# Patient Record
Sex: Male | Born: 1950 | Race: White | Hispanic: No | Marital: Married
Health system: Southern US, Community
[De-identification: ages and names within clinical notes are randomized; demographics above are authoritative.]

## PROBLEM LIST (undated history)

## (undated) DIAGNOSIS — R6520 Severe sepsis without septic shock: Secondary | ICD-10-CM

## (undated) DIAGNOSIS — N170 Acute kidney failure with tubular necrosis: Secondary | ICD-10-CM

## (undated) DIAGNOSIS — U071 COVID-19: Secondary | ICD-10-CM

## (undated) DIAGNOSIS — J1282 Pneumonia due to coronavirus disease 2019: Secondary | ICD-10-CM

## (undated) DIAGNOSIS — A419 Sepsis, unspecified organism: Secondary | ICD-10-CM

## (undated) DIAGNOSIS — J9621 Acute and chronic respiratory failure with hypoxia: Secondary | ICD-10-CM

---

## 2019-09-22 ENCOUNTER — Other Ambulatory Visit (HOSPITAL_COMMUNITY): Payer: Medicare Other

## 2019-09-22 ENCOUNTER — Inpatient Hospital Stay
Admission: AD | Admit: 2019-09-22 | Discharge: 2019-09-25 | Disposition: A | Discharge status: Other Acute Inpatient Hospital | Payer: Medicare Other | Source: Other Acute Inpatient Hospital | Attending: Internal Medicine | Admitting: Internal Medicine

## 2019-09-22 DIAGNOSIS — J9621 Acute and chronic respiratory failure with hypoxia: Secondary | ICD-10-CM | POA: Diagnosis present

## 2019-09-22 DIAGNOSIS — R0602 Shortness of breath: Secondary | ICD-10-CM

## 2019-09-22 DIAGNOSIS — R6520 Severe sepsis without septic shock: Secondary | ICD-10-CM | POA: Diagnosis present

## 2019-09-22 DIAGNOSIS — J1282 Pneumonia due to coronavirus disease 2019: Secondary | ICD-10-CM | POA: Diagnosis present

## 2019-09-22 DIAGNOSIS — U071 COVID-19: Secondary | ICD-10-CM | POA: Diagnosis present

## 2019-09-22 DIAGNOSIS — N170 Acute kidney failure with tubular necrosis: Secondary | ICD-10-CM | POA: Diagnosis present

## 2019-09-22 DIAGNOSIS — A419 Sepsis, unspecified organism: Secondary | ICD-10-CM | POA: Diagnosis present

## 2019-09-22 DIAGNOSIS — J189 Pneumonia, unspecified organism: Secondary | ICD-10-CM

## 2019-09-22 HISTORY — DX: COVID-19: U07.1

## 2019-09-22 HISTORY — DX: Sepsis, unspecified organism: A41.9

## 2019-09-22 HISTORY — DX: Pneumonia due to coronavirus disease 2019: J12.82

## 2019-09-22 HISTORY — DX: Sepsis, unspecified organism: R65.20

## 2019-09-22 HISTORY — DX: Acute kidney failure with tubular necrosis: N17.0

## 2019-09-22 HISTORY — DX: Acute and chronic respiratory failure with hypoxia: J96.21

## 2019-09-22 LAB — URINALYSIS, ROUTINE W REFLEX MICROSCOPIC
Bilirubin Urine: NEGATIVE
Glucose, UA: NEGATIVE mg/dL
Hgb urine dipstick: NEGATIVE
Ketones, ur: NEGATIVE mg/dL
Leukocytes,Ua: NEGATIVE
Nitrite: NEGATIVE
Protein, ur: NEGATIVE mg/dL
Specific Gravity, Urine: 1.017 (ref 1.005–1.030)
pH: 7 (ref 5.0–8.0)

## 2019-09-23 LAB — COMPREHENSIVE METABOLIC PANEL
ALT: 111 U/L — ABNORMAL HIGH (ref 0–44)
AST: 27 U/L (ref 15–41)
Albumin: 2.2 g/dL — ABNORMAL LOW (ref 3.5–5.0)
Alkaline Phosphatase: 84 U/L (ref 38–126)
Anion gap: 10 (ref 5–15)
BUN: 18 mg/dL (ref 8–23)
CO2: 24 mmol/L (ref 22–32)
Calcium: 9.2 mg/dL (ref 8.9–10.3)
Chloride: 97 mmol/L — ABNORMAL LOW (ref 98–111)
Creatinine, Ser: 1.11 mg/dL (ref 0.61–1.24)
GFR calc Af Amer: >60 mL/min (ref >60)
GFR calc non Af Amer: >60 mL/min (ref >60)
Glucose, Bld: 113 mg/dL — ABNORMAL HIGH (ref 70–99)
Potassium: 4.3 mmol/L (ref 3.5–5.1)
Sodium: 131 mmol/L — ABNORMAL LOW (ref 135–145)
Total Bilirubin: 1 mg/dL (ref 0.3–1.2)
Total Protein: 6.9 g/dL (ref 6.5–8.1)

## 2019-09-23 LAB — URINE CULTURE
Culture: NO GROWTH
Special Requests: NORMAL

## 2019-09-23 LAB — CBC WITH DIFFERENTIAL/PLATELET
Abs Immature Granulocytes: 0.28 10*3/uL — ABNORMAL HIGH (ref 0.00–0.07)
Basophils Absolute: 0.1 10*3/uL (ref 0.0–0.1)
Basophils Relative: 0 %
Eosinophils Absolute: 0.6 10*3/uL — ABNORMAL HIGH (ref 0.0–0.5)
Eosinophils Relative: 5 %
HCT: 40.9 % (ref 39.0–52.0)
Hemoglobin: 12.8 g/dL — ABNORMAL LOW (ref 13.0–17.0)
Immature Granulocytes: 2 %
Lymphocytes Relative: 18 %
Lymphs Abs: 2.6 10*3/uL (ref 0.7–4.0)
MCH: 28.3 pg (ref 26.0–34.0)
MCHC: 31.3 g/dL (ref 30.0–36.0)
MCV: 90.3 fL (ref 80.0–100.0)
Monocytes Absolute: 0.8 10*3/uL (ref 0.1–1.0)
Monocytes Relative: 6 %
Neutro Abs: 9.7 10*3/uL — ABNORMAL HIGH (ref 1.7–7.7)
Neutrophils Relative %: 69 %
Platelets: 401 10*3/uL — ABNORMAL HIGH (ref 150–400)
RBC: 4.53 MIL/uL (ref 4.22–5.81)
RDW: 14 % (ref 11.5–15.5)
WBC: 14 10*3/uL — ABNORMAL HIGH (ref 4.0–10.5)
nRBC: 0 % (ref 0.0–0.2)

## 2019-09-23 LAB — CK TOTAL AND CKMB (NOT AT ARMC)
CK, MB: 1.2 ng/mL (ref 0.5–5.0)
Relative Index: INVALID (ref 0.0–2.5)
Total CK: 39 U/L — ABNORMAL LOW (ref 49–397)

## 2019-09-24 ENCOUNTER — Encounter: Payer: Self-pay | Admitting: Internal Medicine

## 2019-09-24 ENCOUNTER — Other Ambulatory Visit (HOSPITAL_COMMUNITY): Payer: Medicare Other

## 2019-09-24 DIAGNOSIS — U071 COVID-19: Secondary | ICD-10-CM | POA: Diagnosis not present

## 2019-09-24 DIAGNOSIS — J9621 Acute and chronic respiratory failure with hypoxia: Secondary | ICD-10-CM | POA: Diagnosis not present

## 2019-09-24 DIAGNOSIS — A419 Sepsis, unspecified organism: Secondary | ICD-10-CM | POA: Diagnosis present

## 2019-09-24 DIAGNOSIS — N170 Acute kidney failure with tubular necrosis: Secondary | ICD-10-CM

## 2019-09-24 DIAGNOSIS — R6520 Severe sepsis without septic shock: Secondary | ICD-10-CM

## 2019-09-24 DIAGNOSIS — J1282 Pneumonia due to coronavirus disease 2019: Secondary | ICD-10-CM | POA: Diagnosis present

## 2019-09-24 LAB — EXPECTORATED SPUTUM ASSESSMENT W GRAM STAIN, RFLX TO RESP C

## 2019-09-24 NOTE — Consult Note (Signed)
Infectious Disease Consultation   Brian Guerra  IOE:[id number]  DOB: Oct 17, 1950  DOA: 09/22/2019  Requesting physician: Dr. Manson Passey  Reason for consultation: Antibiotic recommendations   History of Present Illness: Brian Guerra is an 69 y.o. male who presented to the emergency room and outside facility with 2 weeks of cough, worsening shortness of breath, fever, nausea, diarrhea.  Chest x-ray showed airspace opacities especially in the left base.  Patient was found to be positive for Covid 19 infection.  He was initially started on zinc, vitamin C, Decadron, remdesivir.  He was also given Solu-Medrol.  His procalcitonin was elevated.  He was started on ceftriaxone and azithromycin for empiric bacterial pneumonia.  Sputum cultures were positive for Streptococcus.  Blood and urine cultures were negative.  He completed 5-day course of antibiotic treatment with azithromycin, ceftriaxone, cefdinir.  He also had acute renal failure, elevated CK.  He was treated with IV fluids with improvement. Patient however continued to require heated high flow nasal cannula.  He was given incentive spirometry.  On 09/17/2019 he was started on p.o. prednisone.  On 09/19/2018 when he was started on inhaled corticosteroids because of reactive airway symptoms.  Due to him requiring high flow oxygen, debility he was transferred to [facility] and admitted here on 09/22/2019.  Patient apparently was noted to have fevers and was diagnosed with UTI with Enterococcus and was initially on ampicillin and subsequently switched to amoxicillin.  Blood cultures apparently were negative.  After admission here he was noted to have high fevers of 103.  Chest x-ray showing scattered parenchymal opacities.  Blood cultures, respiratory cultures ordered.  He is complaining of shortness of breath, cough with some sputum.  Denies having any nausea, vomiting, abdominal pain, diarrhea or dysuria.   Review of Systems:  Review  of systems negative except as mentioned above in the HPI.  Past Medical History: Obesity, no pertinent past Medical history since the patient had not seen a physician in years.  Past Surgical History: Orthopedic surgery  Allergies: No known drug allergies  Social History: Prior history of smoking and drinking but the patient quit 15 years ago  Family History: No pertinent family history  Physical Exam: Temperature 103, respiratory rate 33, blood pressure 143/85, pulse oximetry 96%  General appearance: Awake, complaining of shortness of breath Eyes: PERLA, EOMI  ENMT: external ears and nose appear normal, normal hearing, lips appears normal, moist oral mucosa Neck: neck appears normal, no masses, normal ROM CVS: S1-S2, tachycardic Respiratory: Decreased breath sounds lower lobes, rhonchi, no wheezing Abdomen: soft nontender, nondistended, normal bowel sounds Musculoskeletal: No edema Neuro: He has debility with generalized weakness otherwise grossly nonfocal Psych: judgement and insight appear normal, stable mood and affect Skin: no rashes  Data reviewed:  I have personally reviewed following labs and imaging studies Labs:  CBC: Recent Labs  Lab 09/23/19 0717  WBC 14.0*  NEUTROABS 9.7*  HGB 12.8*  HCT 40.9  MCV 90.3  PLT 401*    Basic Metabolic Panel: Recent Labs  Lab 09/23/19 0717  NA 131*  K 4.3  CL 97*  CO2 24  GLUCOSE 113*  BUN 18  CREATININE 1.11  CALCIUM 9.2   GFR CrCl cannot be calculated (Unknown ideal weight.). Liver Function Tests: Recent Labs  Lab 09/23/19 0717  AST 27  ALT 111*  ALKPHOS 84  BILITOT 1.0  PROT 6.9  ALBUMIN 2.2*   No results for input(s): LIPASE, AMYLASE  in the last 168 hours. No results for input(s): AMMONIA in the last 168 hours. Coagulation profile No results for input(s): INR, PROTIME in the last 168 hours.  Cardiac Enzymes: Recent Labs  Lab 09/23/19 0717  CKTOTAL 39*  CKMB 1.2   BNP: Invalid input(s):  POCBNP CBG: No results for input(s): GLUCAP in the last 168 hours. D-Dimer No results for input(s): DDIMER in the last 72 hours. Hgb A1c No results for input(s): HGBA1C in the last 72 hours. Lipid Profile No results for input(s): CHOL, HDL, LDLCALC, TRIG, CHOLHDL, LDLDIRECT in the last 72 hours. Thyroid function studies No results for input(s): TSH, T4TOTAL, T3FREE, THYROIDAB in the last 72 hours.  Invalid input(s): FREET3 Anemia work up No results for input(s): VITAMINB12, FOLATE, FERRITIN, TIBC, IRON, RETICCTPCT in the last 72 hours. Urinalysis    Component Value Date/Time   COLORURINE YELLOW 09/22/2019 2151   APPEARANCEUR CLEAR 09/22/2019 2151   LABSPEC 1.017 09/22/2019 2151   PHURINE 7.0 09/22/2019 2151   GLUCOSEU NEGATIVE 09/22/2019 2151   HGBUR NEGATIVE 09/22/2019 2151   BILIRUBINUR NEGATIVE 09/22/2019 2151   KETONESUR NEGATIVE 09/22/2019 2151   PROTEINUR NEGATIVE 09/22/2019 2151   NITRITE NEGATIVE 09/22/2019 2151   LEUKOCYTESUR NEGATIVE 09/22/2019 2151     Microbiology Recent Results (from the past 240 hour(s))  Culture, Urine     Status: None   Collection Time: 09/22/19  9:47 PM   Specimen: Urine, Random  Result Value Ref Range Status   Specimen Description URINE, RANDOM  Final   Special Requests Normal  Final   Culture   Final    NO GROWTH Performed at [facility], [address], [place], [place] [address]    Report Status 09/23/2019 FINAL  Final  Culture, blood (routine x 2)     Status: None (Preliminary result)   Collection Time: 09/22/19  9:51 PM   Specimen: BLOOD  Result Value Ref Range Status   Specimen Description BLOOD LEFT ANTECUBITAL  Final   Special Requests   Final    BOTTLES DRAWN AEROBIC AND ANAEROBIC Blood Culture results may not be optimal due to an inadequate volume of blood received in culture bottles   Culture   Final    NO GROWTH 2 DAYS Performed at [facility], [address] [place], [place] [address]    Report  Status PENDING  Incomplete  Culture, blood (routine x 2)     Status: None (Preliminary result)   Collection Time: 09/22/19  9:51 PM   Specimen: BLOOD LEFT HAND  Result Value Ref Range Status   Specimen Description BLOOD LEFT HAND  Final   Special Requests   Final    BOTTLES DRAWN AEROBIC AND ANAEROBIC Blood Culture results may not be optimal due to an inadequate volume of blood received in culture bottles   Culture   Final    NO GROWTH 2 DAYS Performed at [facility], [address], [place], [place] [address]    Report Status PENDING  Incomplete  Expectorated sputum assessment w rflx to resp cult     Status: None   Collection Time: 09/23/19 11:01 AM   Specimen: Expectorated Sputum  Result Value Ref Range Status   Specimen Description Expect. Sput  Final   Special Requests NONE  Final   Sputum evaluation   Final    THIS SPECIMEN IS ACCEPTABLE FOR SPUTUM CULTURE Performed at [facility], [address], [place], [place] [address]  Report Status 09/24/2019 FINAL  Final  Culture, respiratory     Status: None (Preliminary result)   Collection Time: 09/23/19 11:01 AM  Result Value Ref Range Status   Specimen Description Expect. Sput  Final   Special Requests NONE Reflexed from [id number]  Final   Gram Stain   Final    FEW WBC PRESENT, PREDOMINANTLY PMN RARE GRAM POSITIVE COCCI RARE GRAM NEGATIVE RODS RARE YEAST    Culture   Final    CULTURE REINCUBATED FOR BETTER GROWTH Performed at [facility], [address] [place], [place] [address]    Report Status PENDING  Incomplete    Inpatient Medications:   Please see MAR   Radiological Exams on Admission: No results found.  Impression/Recommendations Acute hypoxemic respiratory failure COVID-19 infection Fever Pneumonia UTI with Enterococcus Acute renal failure Rhabdomyolysis Transaminitis Diabetes mellitus type 2  Acute hypoxic respiratory failure: He had COVID-19 infection.  He continues to be on  heated high flow nasal cannula.  Continues to complain of shortness of breath.  Chest x-ray on 09/22/2019 showing scattered parenchymal opacities, concerning for possible secondary bacterial pneumonia?  Started on empiric cefepime.  Sputum cultures, blood cultures ordered.  Follow-up on the cultures and adjust antibiotics accordingly.  If his respiratory status is not improving consider obtaining chest CT to better evaluate.  Could be done without contrast if concern for renal compromise given his recent acute renal failure.  COVID-19 infection: He has received treatment with remdesivir, Decadron, Solu-Medrol at the outside facility.  Now started on hydroxyurea, folic acid, continued on Solu-Medrol.  Fever: Exact etiology for the fever is unclear.  Suspect he could be developing secondary bacterial pneumonia.  Started on empiric antibiotics as mentioned above.  Follow-up on the respiratory cultures and adjust antibiotics accordingly.  If he is continuing to have fever while on the cefepime suggest adding empiric vancomycin for MRSA coverage.  However, given his recent acute renal failure we need to monitor his BUN/creatinine very closely while on antibiotics.  If fever persisting despite antibiotics suggest CT of the chest/abdomen/pelvis to evaluate for other infectious etiology.  UTI: He had UTI with Enterococcus at the outside facility.  He was already treated with antibiotics at the outside facility. Here cultures have been sent due to ongoing fevers.  Started on empiric IV cefepime.  Follow-up on the cultures and adjust antibiotics accordingly.  Acute renal failure: Patient had acute renal failure secondary to rhabdomyolysis at the outside facility.  He was given IV fluids with improvement.  Please monitor BUN/trending closely while on the antibiotics.  Avoid nephrotoxic medications.  Transaminitis: He apparently had elevated LFTs at the outside facility.  Continue to monitor.  Further management per  the primary team.  Diabetes mellitus: Patient apparently had hemoglobin A1c of 6.3.  Continue Accu-Cheks, insulin sliding scale.  Further management per the primary team.  Due to his complex medical problems he is at risk for worsening and decompensation.  Plan of care discussed with the primary team and pharmacy.  Thank you for this consultation.   Vonzella Nipple M.D. 09/24/2019, 4:17 PM

## 2019-09-24 NOTE — Consult Note (Signed)
Pulmonary Critical Care Medicine [facility] [place]  PULMONARY SERVICE  Date of Service: 09/24/2019  PULMONARY CRITICAL CARE CONSULT   Brian Guerra  ZDG:[id number]  DOB: 01-18-1951   DOA: 09/22/2019  Referring Physician: Carron Curie, MD  HPI: Brian Guerra is a 69 y.o. male seen for follow up of Acute on Chronic Respiratory Failure.  Patient has multiple medical problems including severe sepsis acute renal failure rhabdomyolysis enterococcal urinary tract infection who presents to the hospital because of increasing cough and shortness of breath.  Patient has been noted on prior to admission to be having 2 weeks duration of the symptoms.  Chest x-ray revealed airspace opacities concern was made for COVID-19.  Swab was done which was apparently positive.  Patient was treated with Decadron remdesivir completed 5-day course however had continued worsening of his symptoms.  Eventually the patient was requiring high flow oxygen.  Patient also had elevated procalcitonin levels and was started on Rocephin and Zithromax.  The steroids were eventually tapered down.  Right now patient is on high flow oxygen at 40 L.  Review of Systems:  ROS performed and is unremarkable other than noted above.  Past medical history: COVID-19 Acute renal failure Sepsis Rhabdomyolysis Obesity  Past surgical history: Orthopedic surgery  Social history: History of tobacco or alcohol use quit 15 years ago  Family History: Non-Contributory to the present illness  Allergies: No known drug allergies  Medications: Reviewed on Rounds  Physical Exam:  Vitals: Temperature 98.1 pulse 87 respiratory rate 37 blood pressure is 113/67 saturations 95%  Ventilator Settings on 40 L with 100% FiO2  . General: Comfortable at this time . Eyes: Grossly normal lids, irises & conjunctiva . ENT: grossly tongue is normal . Neck: no obvious mass . Cardiovascular: S1-S2 normal no gallop or rub . Respiratory:  Scattered rhonchi coarse breath sounds noted bilaterally . Abdomen: Soft and nontender . Skin: no rash seen on limited exam . Musculoskeletal: not rigid . Psychiatric:unable to assess . Neurologic: no seizure no involuntary movements         Labs on Admission:  Basic Metabolic Panel: Recent Labs  Lab 09/23/19 0717  NA 131*  K 4.3  CL 97*  CO2 24  GLUCOSE 113*  BUN 18  CREATININE 1.11  CALCIUM 9.2    No results for input(s): PHART, PCO2ART, PO2ART, HCO3, O2SAT in the last 168 hours.  Liver Function Tests: Recent Labs  Lab 09/23/19 0717  AST 27  ALT 111*  ALKPHOS 84  BILITOT 1.0  PROT 6.9  ALBUMIN 2.2*   No results for input(s): LIPASE, AMYLASE in the last 168 hours. No results for input(s): AMMONIA in the last 168 hours.  CBC: Recent Labs  Lab 09/23/19 0717  WBC 14.0*  NEUTROABS 9.7*  HGB 12.8*  HCT 40.9  MCV 90.3  PLT 401*    Cardiac Enzymes: Recent Labs  Lab 09/23/19 0717  CKTOTAL 39*  CKMB 1.2    BNP (last 3 results) No results for input(s): BNP in the last 8760 hours.  ProBNP (last 3 results) No results for input(s): PROBNP in the last 8760 hours.   Radiological Exams on Admission: DG CHEST PORT 1 VIEW  Result Date: 09/24/2019 CLINICAL DATA:  69 year old male with shortness of breath. Positive COVID-19. EXAM: PORTABLE CHEST 1 VIEW COMPARISON:  Chest radiograph dated 09/22/2019. FINDINGS: Bilateral mid to lower lung field airspace opacities similar or slightly worsened since the prior radiograph. No pleural effusion or pneumothorax. Stable cardiac silhouette. Atherosclerotic calcification of  the aorta. No acute osseous pathology. IMPRESSION: Bilateral mid to lower lung field airspace opacities similar or slightly worsened since the prior radiograph. Electronically Signed   By: Elgie Collard M.D.   On: 09/24/2019 17:47   DG CHEST PORT 1 VIEW  Result Date: 09/22/2019 CLINICAL DATA:  History of COVID-19 positivity EXAM: PORTABLE CHEST 1 VIEW  COMPARISON:  None. FINDINGS: Cardiac shadow is at the upper limits of normal in size. Aortic calcifications are noted. Diffuse parenchymal opacities are noted bilaterally primarily in the bases without sizable effusion. No bony abnormality is noted. IMPRESSION: Scattered parenchymal opacities consistent with the patient's given clinical history of COVID-19 positivity. Electronically Signed   By: Alcide Clever M.D.   On: 09/22/2019 13:08    Assessment/Plan Active Problems:   Acute on chronic respiratory failure with hypoxia (HCC)   COVID-19 virus infection   Pneumonia due to COVID-19 virus   Acute renal failure due to tubular necrosis (HCC)   Severe sepsis (HCC)   1. Acute on chronic respiratory failure with hypoxia patient is currently on 40 L 100% FiO2.  Continue to monitor the patient's ABGs closely.  Chest x-ray still showing parenchymal opacities need to monitor closely for decompensation might possibly require intubation. 2. COVID-19 viral infection has been in resolution however patient has severe pulmonary damage 3. COVID-19 pneumonia has been completed remdesivir Decadron and Solu-Medrol slow to show improvement. 4. Acute renal failure due to tubular necrosis we will need to monitor patient's fluid status creatinine showing some improvement at 1.11 5. Severe sepsis has resolved right now hemodynamics are stable  I have personally seen and evaluated the patient, evaluated laboratory and imaging results, formulated the assessment and plan and placed orders. The Patient requires high complexity decision making with multiple systems involvement.  Case was discussed on Rounds with the Respiratory Therapy Director and the Respiratory staff Time Spent  Yevonne Pax, MD [facility] Pulmonary Critical Care Medicine Sleep Medicine

## 2019-09-25 ENCOUNTER — Inpatient Hospital Stay (HOSPITAL_COMMUNITY)
Admission: EM | Admit: 2019-09-25 | Discharge: 2019-10-14 | DRG: 207 | Disposition: E | Payer: Medicare Other | Source: Other Acute Inpatient Hospital | Attending: Pulmonary Disease | Admitting: Pulmonary Disease

## 2019-09-25 ENCOUNTER — Inpatient Hospital Stay (HOSPITAL_COMMUNITY): Payer: Medicare Other

## 2019-09-25 ENCOUNTER — Other Ambulatory Visit: Payer: Self-pay

## 2019-09-25 ENCOUNTER — Encounter (HOSPITAL_COMMUNITY): Payer: Medicare Other | Admitting: Certified Registered"

## 2019-09-25 ENCOUNTER — Inpatient Hospital Stay: Payer: Self-pay

## 2019-09-25 DIAGNOSIS — N170 Acute kidney failure with tubular necrosis: Secondary | ICD-10-CM | POA: Diagnosis present

## 2019-09-25 DIAGNOSIS — N179 Acute kidney failure, unspecified: Secondary | ICD-10-CM | POA: Diagnosis present

## 2019-09-25 DIAGNOSIS — R197 Diarrhea, unspecified: Secondary | ICD-10-CM | POA: Diagnosis not present

## 2019-09-25 DIAGNOSIS — R6521 Severe sepsis with septic shock: Secondary | ICD-10-CM | POA: Diagnosis present

## 2019-09-25 DIAGNOSIS — J9621 Acute and chronic respiratory failure with hypoxia: Secondary | ICD-10-CM | POA: Diagnosis not present

## 2019-09-25 DIAGNOSIS — Z4659 Encounter for fitting and adjustment of other gastrointestinal appliance and device: Secondary | ICD-10-CM

## 2019-09-25 DIAGNOSIS — J80 Acute respiratory distress syndrome: Secondary | ICD-10-CM | POA: Diagnosis present

## 2019-09-25 DIAGNOSIS — E871 Hypo-osmolality and hyponatremia: Secondary | ICD-10-CM | POA: Diagnosis present

## 2019-09-25 DIAGNOSIS — A4189 Other specified sepsis: Secondary | ICD-10-CM | POA: Diagnosis present

## 2019-09-25 DIAGNOSIS — A419 Sepsis, unspecified organism: Secondary | ICD-10-CM | POA: Diagnosis not present

## 2019-09-25 DIAGNOSIS — J690 Pneumonitis due to inhalation of food and vomit: Secondary | ICD-10-CM | POA: Diagnosis present

## 2019-09-25 DIAGNOSIS — E875 Hyperkalemia: Secondary | ICD-10-CM | POA: Diagnosis present

## 2019-09-25 DIAGNOSIS — I469 Cardiac arrest, cause unspecified: Secondary | ICD-10-CM | POA: Diagnosis present

## 2019-09-25 DIAGNOSIS — U071 COVID-19: Secondary | ICD-10-CM

## 2019-09-25 DIAGNOSIS — B948 Sequelae of other specified infectious and parasitic diseases: Secondary | ICD-10-CM | POA: Diagnosis not present

## 2019-09-25 DIAGNOSIS — J154 Pneumonia due to other streptococci: Secondary | ICD-10-CM | POA: Diagnosis present

## 2019-09-25 DIAGNOSIS — E878 Other disorders of electrolyte and fluid balance, not elsewhere classified: Secondary | ICD-10-CM | POA: Diagnosis present

## 2019-09-25 DIAGNOSIS — E872 Acidosis: Secondary | ICD-10-CM | POA: Diagnosis present

## 2019-09-25 DIAGNOSIS — Z87891 Personal history of nicotine dependence: Secondary | ICD-10-CM | POA: Diagnosis not present

## 2019-09-25 DIAGNOSIS — E1165 Type 2 diabetes mellitus with hyperglycemia: Secondary | ICD-10-CM | POA: Diagnosis present

## 2019-09-25 DIAGNOSIS — J9601 Acute respiratory failure with hypoxia: Secondary | ICD-10-CM | POA: Diagnosis not present

## 2019-09-25 DIAGNOSIS — Z452 Encounter for adjustment and management of vascular access device: Secondary | ICD-10-CM

## 2019-09-25 DIAGNOSIS — R1110 Vomiting, unspecified: Secondary | ICD-10-CM

## 2019-09-25 DIAGNOSIS — R609 Edema, unspecified: Secondary | ICD-10-CM | POA: Diagnosis not present

## 2019-09-25 DIAGNOSIS — Z66 Do not resuscitate: Secondary | ICD-10-CM | POA: Diagnosis not present

## 2019-09-25 DIAGNOSIS — R0902 Hypoxemia: Secondary | ICD-10-CM

## 2019-09-25 DIAGNOSIS — Z01818 Encounter for other preprocedural examination: Secondary | ICD-10-CM

## 2019-09-25 DIAGNOSIS — Z515 Encounter for palliative care: Secondary | ICD-10-CM | POA: Diagnosis not present

## 2019-09-25 DIAGNOSIS — R000 Tachycardia, unspecified: Secondary | ICD-10-CM

## 2019-09-25 DIAGNOSIS — Z978 Presence of other specified devices: Secondary | ICD-10-CM

## 2019-09-25 DIAGNOSIS — I2699 Other pulmonary embolism without acute cor pulmonale: Secondary | ICD-10-CM | POA: Diagnosis not present

## 2019-09-25 LAB — BASIC METABOLIC PANEL
Anion gap: 9 (ref 5–15)
Anion gap: 11 (ref 5–15)
BUN: 26 mg/dL — ABNORMAL HIGH (ref 8–23)
BUN: 45 mg/dL — ABNORMAL HIGH (ref 8–23)
CO2: 23 mmol/L (ref 22–32)
CO2: 26 mmol/L (ref 22–32)
Calcium: 8.9 mg/dL (ref 8.9–10.3)
Calcium: 8.9 mg/dL (ref 8.9–10.3)
Chloride: 98 mmol/L (ref 98–111)
Chloride: 99 mmol/L (ref 98–111)
Creatinine, Ser: 1.26 mg/dL — ABNORMAL HIGH (ref 0.61–1.24)
Creatinine, Ser: 2.44 mg/dL — ABNORMAL HIGH (ref 0.61–1.24)
GFR calc Af Amer: >60 mL/min (ref >60)
GFR calc Af Amer: 31 mL/min — ABNORMAL LOW (ref >60)
GFR calc non Af Amer: 59 mL/min — ABNORMAL LOW (ref >60)
GFR calc non Af Amer: 26 mL/min — ABNORMAL LOW (ref >60)
Glucose, Bld: 167 mg/dL — ABNORMAL HIGH (ref 70–99)
Glucose, Bld: 169 mg/dL — ABNORMAL HIGH (ref 70–99)
Potassium: 4.7 mmol/L (ref 3.5–5.1)
Potassium: 5.9 mmol/L — ABNORMAL HIGH (ref 3.5–5.1)
Sodium: 130 mmol/L — ABNORMAL LOW (ref 135–145)
Sodium: 136 mmol/L (ref 135–145)

## 2019-09-25 LAB — CBC
HCT: 38.9 % — ABNORMAL LOW (ref 39.0–52.0)
HCT: 43.1 % (ref 39.0–52.0)
Hemoglobin: 12.3 g/dL — ABNORMAL LOW (ref 13.0–17.0)
Hemoglobin: 13 g/dL (ref 13.0–17.0)
MCH: 28.5 pg (ref 26.0–34.0)
MCH: 28.3 pg (ref 26.0–34.0)
MCHC: 31.6 g/dL (ref 30.0–36.0)
MCHC: 30.2 g/dL (ref 30.0–36.0)
MCV: 90 fL (ref 80.0–100.0)
MCV: 93.7 fL (ref 80.0–100.0)
Platelets: 423 10*3/uL — ABNORMAL HIGH (ref 150–400)
Platelets: 467 K/uL — ABNORMAL HIGH (ref 150–400)
RBC: 4.32 MIL/uL (ref 4.22–5.81)
RBC: 4.6 MIL/uL (ref 4.22–5.81)
RDW: 13.7 % (ref 11.5–15.5)
RDW: 13.9 % (ref 11.5–15.5)
WBC: 21.9 10*3/uL — ABNORMAL HIGH (ref 4.0–10.5)
WBC: 25.1 K/uL — ABNORMAL HIGH (ref 4.0–10.5)
nRBC: 0.1 % (ref 0.0–0.2)
nRBC: 0.1 % (ref 0.0–0.2)

## 2019-09-25 LAB — COMPREHENSIVE METABOLIC PANEL WITH GFR
ALT: 157 U/L — ABNORMAL HIGH (ref 0–44)
AST: 70 U/L — ABNORMAL HIGH (ref 15–41)
Albumin: 2.1 g/dL — ABNORMAL LOW (ref 3.5–5.0)
Alkaline Phosphatase: 148 U/L — ABNORMAL HIGH (ref 38–126)
Anion gap: 9 (ref 5–15)
BUN: 28 mg/dL — ABNORMAL HIGH (ref 8–23)
CO2: 27 mmol/L (ref 22–32)
Calcium: 9.5 mg/dL (ref 8.9–10.3)
Chloride: 98 mmol/L (ref 98–111)
Creatinine, Ser: 1.28 mg/dL — ABNORMAL HIGH (ref 0.61–1.24)
GFR calc Af Amer: >60 mL/min
GFR calc non Af Amer: 58 mL/min — ABNORMAL LOW
Glucose, Bld: 207 mg/dL — ABNORMAL HIGH (ref 70–99)
Potassium: 5.2 mmol/L — ABNORMAL HIGH (ref 3.5–5.1)
Sodium: 134 mmol/L — ABNORMAL LOW (ref 135–145)
Total Bilirubin: 1 mg/dL (ref 0.3–1.2)
Total Protein: 7.6 g/dL (ref 6.5–8.1)

## 2019-09-25 LAB — BLOOD GAS, ARTERIAL
Acid-base deficit: 2.7 mmol/L — ABNORMAL HIGH (ref 0.0–2.0)
Acid-base deficit: 2.5 mmol/L — ABNORMAL HIGH (ref 0.0–2.0)
Bicarbonate: 22.8 mmol/L (ref 20.0–28.0)
Bicarbonate: 27.3 mmol/L (ref 20.0–28.0)
Drawn by: 51155
FIO2: 100
FIO2: 100
O2 Saturation: 87.4 %
O2 Saturation: 93.1 %
Patient temperature: 38.5
Patient temperature: 38.5
pCO2 arterial: 51.4 mmHg — ABNORMAL HIGH (ref 32.0–48.0)
pCO2 arterial: 114 mmHg (ref 32.0–48.0)
pH, Arterial: 7.279 — ABNORMAL LOW (ref 7.350–7.450)
pH, Arterial: 7.022 — CL (ref 7.350–7.450)
pO2, Arterial: 70.1 mmHg — ABNORMAL LOW (ref 83.0–108.0)
pO2, Arterial: 114 mmHg — ABNORMAL HIGH (ref 83.0–108.0)

## 2019-09-25 LAB — URINALYSIS, ROUTINE W REFLEX MICROSCOPIC
Bilirubin Urine: NEGATIVE
Glucose, UA: NEGATIVE mg/dL
Ketones, ur: NEGATIVE mg/dL
Leukocytes,Ua: NEGATIVE
Nitrite: NEGATIVE
Protein, ur: 30 mg/dL — AB
Specific Gravity, Urine: 1.021 (ref 1.005–1.030)
pH: 5 (ref 5.0–8.0)

## 2019-09-25 LAB — POCT I-STAT 7, (LYTES, BLD GAS, ICA,H+H)
Acid-Base Excess: 0 mmol/L (ref 0.0–2.0)
Acid-base deficit: 2 mmol/L (ref 0.0–2.0)
Acid-base deficit: 4 mmol/L — ABNORMAL HIGH (ref 0.0–2.0)
Bicarbonate: 28.9 mmol/L — ABNORMAL HIGH (ref 20.0–28.0)
Bicarbonate: 27.1 mmol/L (ref 20.0–28.0)
Bicarbonate: 29.1 mmol/L — ABNORMAL HIGH (ref 20.0–28.0)
Calcium, Ion: 1.23 mmol/L (ref 1.15–1.40)
Calcium, Ion: 1.23 mmol/L (ref 1.15–1.40)
Calcium, Ion: 1.23 mmol/L (ref 1.15–1.40)
HCT: 38 % — ABNORMAL LOW (ref 39.0–52.0)
HCT: 38 % — ABNORMAL LOW (ref 39.0–52.0)
HCT: 36 % — ABNORMAL LOW (ref 39.0–52.0)
Hemoglobin: 12.9 g/dL — ABNORMAL LOW (ref 13.0–17.0)
Hemoglobin: 12.9 g/dL — ABNORMAL LOW (ref 13.0–17.0)
Hemoglobin: 12.2 g/dL — ABNORMAL LOW (ref 13.0–17.0)
O2 Saturation: 96 %
O2 Saturation: 98 %
O2 Saturation: 98 %
Patient temperature: 36.5
Patient temperature: 97.8
Potassium: 6.1 mmol/L — ABNORMAL HIGH (ref 3.5–5.1)
Potassium: 6.4 mmol/L (ref 3.5–5.1)
Potassium: 5.7 mmol/L — ABNORMAL HIGH (ref 3.5–5.1)
Sodium: 135 mmol/L (ref 135–145)
Sodium: 135 mmol/L (ref 135–145)
Sodium: 134 mmol/L — ABNORMAL LOW (ref 135–145)
TCO2: 31 mmol/L (ref 22–32)
TCO2: 30 mmol/L (ref 22–32)
TCO2: 31 mmol/L (ref 22–32)
pCO2 arterial: 79.5 mmHg (ref 32.0–48.0)
pCO2 arterial: 80.1 mmHg (ref 32.0–48.0)
pCO2 arterial: 71.6 mmHg (ref 32.0–48.0)
pH, Arterial: 7.165 — CL (ref 7.350–7.450)
pH, Arterial: 7.138 — CL (ref 7.350–7.450)
pH, Arterial: 7.214 — ABNORMAL LOW (ref 7.350–7.450)
pO2, Arterial: 109 mmHg — ABNORMAL HIGH (ref 83.0–108.0)
pO2, Arterial: 144 mmHg — ABNORMAL HIGH (ref 83.0–108.0)
pO2, Arterial: 138 mmHg — ABNORMAL HIGH (ref 83.0–108.0)

## 2019-09-25 LAB — ECHOCARDIOGRAM COMPLETE
Height: 67 in
S' Lateral: 3.35 cm
Weight: 2987.67 oz

## 2019-09-25 LAB — GLUCOSE, CAPILLARY
Glucose-Capillary: 205 mg/dL — ABNORMAL HIGH (ref 70–99)
Glucose-Capillary: 176 mg/dL — ABNORMAL HIGH (ref 70–99)
Glucose-Capillary: 188 mg/dL — ABNORMAL HIGH (ref 70–99)
Glucose-Capillary: 153 mg/dL — ABNORMAL HIGH (ref 70–99)

## 2019-09-25 LAB — PHOSPHORUS: Phosphorus: 5.8 mg/dL — ABNORMAL HIGH (ref 2.5–4.6)

## 2019-09-25 LAB — HEMOGLOBIN A1C
Hgb A1c MFr Bld: 6.7 % — ABNORMAL HIGH (ref 4.8–5.6)
Mean Plasma Glucose: 145.59 mg/dL

## 2019-09-25 LAB — HIV ANTIBODY (ROUTINE TESTING W REFLEX): HIV Screen 4th Generation wRfx: NONREACTIVE

## 2019-09-25 LAB — TROPONIN I (HIGH SENSITIVITY)
Troponin I (High Sensitivity): 441 ng/L (ref <18)
Troponin I (High Sensitivity): 870 ng/L (ref <18)

## 2019-09-25 LAB — MAGNESIUM: Magnesium: 2.3 mg/dL (ref 1.7–2.4)

## 2019-09-25 LAB — PROCALCITONIN: Procalcitonin: 3.18 ng/mL

## 2019-09-25 LAB — D-DIMER, QUANTITATIVE: D-Dimer, Quant: 9.03 ug{FEU}/mL — ABNORMAL HIGH (ref 0.00–0.50)

## 2019-09-25 LAB — CK: Total CK: 124 U/L (ref 49–397)

## 2019-09-25 MED ORDER — SODIUM CHLORIDE 0.9 % IV SOLN: INTRAVENOUS | Status: DC | PRN

## 2019-09-25 MED ORDER — INSULIN ASPART 100 UNIT/ML ~~LOC~~ SOLN
0.0000 [IU] | SUBCUTANEOUS | Status: DC
  Administered 2019-09-25: 5 [IU] via SUBCUTANEOUS
  Administered 2019-09-25 (×3): 3 [IU] via SUBCUTANEOUS
  Administered 2019-09-26: 5 [IU] via SUBCUTANEOUS
  Administered 2019-09-26 (×2): 3 [IU] via SUBCUTANEOUS
  Administered 2019-09-26: 2 [IU] via SUBCUTANEOUS
  Administered 2019-09-26: 3 [IU] via SUBCUTANEOUS
  Administered 2019-09-26: 5 [IU] via SUBCUTANEOUS
  Administered 2019-09-27 (×2): 3 [IU] via SUBCUTANEOUS
  Administered 2019-09-27 (×2): 5 [IU] via SUBCUTANEOUS
  Administered 2019-09-27: 3 [IU] via SUBCUTANEOUS
  Administered 2019-09-28 (×2): 5 [IU] via SUBCUTANEOUS
  Administered 2019-09-28 – 2019-09-29 (×6): 3 [IU] via SUBCUTANEOUS
  Administered 2019-09-29: 2 [IU] via SUBCUTANEOUS
  Administered 2019-09-29: 3 [IU] via SUBCUTANEOUS
  Administered 2019-09-29: 2 [IU] via SUBCUTANEOUS
  Administered 2019-09-30 (×3): 3 [IU] via SUBCUTANEOUS
  Administered 2019-09-30: 2 [IU] via SUBCUTANEOUS
  Administered 2019-09-30: 5 [IU] via SUBCUTANEOUS
  Administered 2019-10-01: 8 [IU] via SUBCUTANEOUS
  Administered 2019-10-01: 5 [IU] via SUBCUTANEOUS
  Administered 2019-10-01: 11 [IU] via SUBCUTANEOUS
  Administered 2019-10-01: 8 [IU] via SUBCUTANEOUS

## 2019-09-25 MED ORDER — HEPARIN SODIUM (PORCINE) 5000 UNIT/ML IJ SOLN
5000.0000 [IU] | Freq: Three times a day (TID) | INTRAMUSCULAR | Status: DC
  Administered 2019-09-25 – 2019-09-29 (×12): 5000 [IU] via SUBCUTANEOUS
  Filled 2019-09-25 (×11): qty 1

## 2019-09-25 MED ORDER — CHLORHEXIDINE GLUCONATE 0.12% ORAL RINSE (MEDLINE KIT)
15.0000 mL | Freq: Two times a day (BID) | OROMUCOSAL | Status: DC
  Administered 2019-09-25 – 2019-10-01 (×13): 15 mL via OROMUCOSAL

## 2019-09-25 MED ORDER — ETOMIDATE 2 MG/ML IV SOLN
INTRAVENOUS | Status: DC | PRN
  Administered 2019-09-25: 10 mg via INTRAVENOUS

## 2019-09-25 MED ORDER — FENTANYL BOLUS VIA INFUSION
25.0000 ug | INTRAVENOUS | Status: DC | PRN
  Administered 2019-09-27 – 2019-09-30 (×3): 25 ug via INTRAVENOUS
  Filled 2019-09-25: qty 25

## 2019-09-25 MED ORDER — SODIUM CHLORIDE 0.9 % IV BOLUS
1000.0000 mL | Freq: Once | INTRAVENOUS | Status: AC
  Administered 2019-09-25: 1000 mL via INTRAVENOUS

## 2019-09-25 MED ORDER — PROSOURCE TF PO LIQD
90.0000 mL | Freq: Three times a day (TID) | ORAL | Status: DC
  Administered 2019-09-25 – 2019-09-28 (×9): 90 mL
  Filled 2019-09-25 (×10): qty 90

## 2019-09-25 MED ORDER — DOCUSATE SODIUM 50 MG/5ML PO LIQD
100.0000 mg | Freq: Two times a day (BID) | ORAL | Status: DC
  Administered 2019-09-25 – 2019-09-30 (×5): 100 mg via ORAL
  Filled 2019-09-25 (×5): qty 10

## 2019-09-25 MED ORDER — NOREPINEPHRINE 4 MG/250ML-% IV SOLN
0.0000 ug/min | INTRAVENOUS | Status: DC
  Administered 2019-09-26: 5 ug/min via INTRAVENOUS
  Administered 2019-09-26: 6 ug/min via INTRAVENOUS
  Filled 2019-09-25 (×3): qty 250

## 2019-09-25 MED ORDER — SODIUM ZIRCONIUM CYCLOSILICATE 10 G PO PACK
10.0000 g | PACK | Freq: Once | ORAL | Status: AC
  Administered 2019-09-25: 10 g
  Filled 2019-09-25: qty 1

## 2019-09-25 MED ORDER — PROPOFOL 10 MG/ML IV BOLUS
INTRAVENOUS | Status: DC | PRN
  Administered 2019-09-25: 50 mg via INTRAVENOUS
  Administered 2019-09-25: 70 mg via INTRAVENOUS
  Administered 2019-09-25: 130 mg via INTRAVENOUS

## 2019-09-25 MED ORDER — INSULIN ASPART 100 UNIT/ML IV SOLN
8.0000 [IU] | Freq: Once | INTRAVENOUS | Status: AC
  Administered 2019-09-25: 8 [IU] via INTRAVENOUS

## 2019-09-25 MED ORDER — SODIUM CHLORIDE 0.9 % IV SOLN
2.0000 g | Freq: Three times a day (TID) | INTRAVENOUS | Status: DC
  Administered 2019-09-25 (×2): 2 g via INTRAVENOUS
  Filled 2019-09-25 (×4): qty 2

## 2019-09-25 MED ORDER — NOREPINEPHRINE 4 MG/250ML-% IV SOLN
INTRAVENOUS | Status: AC
  Administered 2019-09-25: 20 ug/min via INTRAVENOUS
  Filled 2019-09-25: qty 250

## 2019-09-25 MED ORDER — SODIUM BICARBONATE 8.4 % IV SOLN
100.0000 meq | Freq: Once | INTRAVENOUS | Status: AC
  Administered 2019-09-25: 100 meq via INTRAVENOUS

## 2019-09-25 MED ORDER — EPINEPHRINE 1 MG/10ML IJ SOSY
PREFILLED_SYRINGE | INTRAMUSCULAR | Status: AC
  Filled 2019-09-25: qty 10

## 2019-09-25 MED ORDER — SODIUM BICARBONATE 8.4 % IV SOLN
INTRAVENOUS | Status: AC
  Filled 2019-09-25: qty 100

## 2019-09-25 MED ORDER — CALCIUM GLUCONATE-NACL 1-0.675 GM/50ML-% IV SOLN
1.0000 g | Freq: Once | INTRAVENOUS | Status: AC
  Administered 2019-09-25: 1000 mg via INTRAVENOUS
  Filled 2019-09-25: qty 50

## 2019-09-25 MED ORDER — FENTANYL CITRATE (PF) 100 MCG/2ML IJ SOLN
INTRAMUSCULAR | Status: AC
  Filled 2019-09-25: qty 2

## 2019-09-25 MED ORDER — METHYLPREDNISOLONE SODIUM SUCC 40 MG IJ SOLR
40.0000 mg | Freq: Two times a day (BID) | INTRAMUSCULAR | Status: DC
  Administered 2019-09-25 – 2019-09-27 (×6): 40 mg via INTRAVENOUS
  Filled 2019-09-25 (×6): qty 1

## 2019-09-25 MED ORDER — CHLORHEXIDINE GLUCONATE CLOTH 2 % EX PADS
6.0000 | MEDICATED_PAD | Freq: Every day | CUTANEOUS | Status: DC
  Administered 2019-09-25 – 2019-10-01 (×9): 6 via TOPICAL

## 2019-09-25 MED ORDER — VANCOMYCIN HCL IN DEXTROSE 1-5 GM/200ML-% IV SOLN
1000.0000 mg | INTRAVENOUS | Status: DC
  Administered 2019-09-26 – 2019-09-27 (×2): 1000 mg via INTRAVENOUS
  Filled 2019-09-25 (×2): qty 200

## 2019-09-25 MED ORDER — POLYETHYLENE GLYCOL 3350 17 G PO PACK
17.0000 g | PACK | Freq: Every day | ORAL | Status: DC
  Administered 2019-09-25 – 2019-09-26 (×2): 17 g via ORAL
  Filled 2019-09-25 (×2): qty 1

## 2019-09-25 MED ORDER — VANCOMYCIN HCL 2000 MG/400ML IV SOLN
2000.0000 mg | Freq: Once | INTRAVENOUS | Status: AC
  Administered 2019-09-25: 2000 mg via INTRAVENOUS
  Filled 2019-09-25: qty 400

## 2019-09-25 MED ORDER — FENTANYL CITRATE (PF) 100 MCG/2ML IJ SOLN
25.0000 ug | Freq: Once | INTRAMUSCULAR | Status: AC
  Administered 2019-09-25: 25 ug via INTRAVENOUS

## 2019-09-25 MED ORDER — ORAL CARE MOUTH RINSE
15.0000 mL | OROMUCOSAL | Status: DC
  Administered 2019-09-25 – 2019-10-01 (×61): 15 mL via OROMUCOSAL

## 2019-09-25 MED ORDER — DEXTROSE 50 % IV SOLN
25.0000 mL | Freq: Once | INTRAVENOUS | Status: AC
  Administered 2019-09-25: 25 mL via INTRAVENOUS
  Filled 2019-09-25: qty 50

## 2019-09-25 MED ORDER — SODIUM CHLORIDE 0.9% FLUSH
10.0000 mL | Freq: Two times a day (BID) | INTRAVENOUS | Status: DC
  Administered 2019-09-26 – 2019-10-01 (×9): 10 mL

## 2019-09-25 MED ORDER — SUCCINYLCHOLINE CHLORIDE 20 MG/ML IJ SOLN
INTRAMUSCULAR | Status: DC | PRN
  Administered 2019-09-25: 100 mg via INTRAVENOUS

## 2019-09-25 MED ORDER — STERILE WATER FOR INJECTION IV SOLN
INTRAVENOUS | Status: DC
  Filled 2019-09-25 (×2): qty 850

## 2019-09-25 MED ORDER — VITAL AF 1.2 CAL PO LIQD
1000.0000 mL | ORAL | Status: DC
  Administered 2019-09-25 – 2019-09-26 (×3): 1000 mL

## 2019-09-25 MED ORDER — ROCURONIUM BROMIDE 50 MG/5ML IV SOLN
50.0000 mg | Freq: Once | INTRAVENOUS | Status: AC
  Administered 2019-09-25: 50 mg via INTRAVENOUS
  Filled 2019-09-25: qty 5

## 2019-09-25 MED ORDER — VECURONIUM BROMIDE 10 MG IV SOLR
10.0000 mg | Freq: Once | INTRAVENOUS | Status: AC
  Administered 2019-09-25: 10 mg via INTRAVENOUS
  Filled 2019-09-25: qty 10

## 2019-09-25 MED ORDER — FENTANYL 2500MCG IN NS 250ML (10MCG/ML) PREMIX INFUSION
25.0000 ug/h | INTRAVENOUS | Status: DC
  Administered 2019-09-25: 25 ug/h via INTRAVENOUS
  Administered 2019-09-26: 26 ug/h via INTRAVENOUS
  Administered 2019-09-27: 35 ug/h via INTRAVENOUS
  Administered 2019-09-28: 150 ug/h via INTRAVENOUS
  Administered 2019-09-28 – 2019-10-01 (×5): 200 ug/h via INTRAVENOUS
  Filled 2019-09-25 (×10): qty 250

## 2019-09-25 MED ORDER — ROCURONIUM BROMIDE 10 MG/ML (PF) SYRINGE
PREFILLED_SYRINGE | INTRAVENOUS | Status: AC
  Filled 2019-09-25: qty 10

## 2019-09-25 MED ORDER — SODIUM CHLORIDE 0.9 % IV SOLN
2.0000 g | Freq: Two times a day (BID) | INTRAVENOUS | Status: DC
  Administered 2019-09-26 – 2019-09-27 (×3): 2 g via INTRAVENOUS
  Filled 2019-09-25 (×5): qty 2

## 2019-09-25 MED ORDER — NOREPINEPHRINE 4 MG/250ML-% IV SOLN
INTRAVENOUS | Status: AC
  Filled 2019-09-25: qty 500

## 2019-09-25 MED ORDER — PANTOPRAZOLE SODIUM 40 MG PO PACK
40.0000 mg | PACK | Freq: Every day | ORAL | Status: DC
  Administered 2019-09-25 – 2019-09-28 (×4): 40 mg
  Filled 2019-09-25 (×4): qty 20

## 2019-09-25 MED ORDER — FENTANYL CITRATE (PF) 100 MCG/2ML IJ SOLN
100.0000 ug | Freq: Once | INTRAMUSCULAR | Status: AC
  Administered 2019-09-25: 100 ug via INTRAVENOUS

## 2019-09-25 MED ORDER — POLYETHYLENE GLYCOL 3350 17 G PO PACK: 17.0000 g | PACK | Freq: Every day | ORAL | Status: DC | PRN

## 2019-09-25 MED ORDER — VANCOMYCIN HCL 750 MG/150ML IV SOLN
750.0000 mg | Freq: Two times a day (BID) | INTRAVENOUS | Status: DC
  Filled 2019-09-25: qty 150

## 2019-09-25 MED ORDER — PROPOFOL 1000 MG/100ML IV EMUL
0.0000 ug/kg/min | INTRAVENOUS | Status: DC
  Administered 2019-09-25: 50 ug/kg/min via INTRAVENOUS
  Administered 2019-09-25: 30 ug/kg/min via INTRAVENOUS
  Administered 2019-09-25 (×4): 50 ug/kg/min via INTRAVENOUS
  Administered 2019-09-26: 40 ug/kg/min via INTRAVENOUS
  Administered 2019-09-26 (×2): 35 ug/kg/min via INTRAVENOUS
  Administered 2019-09-26: 40 ug/kg/min via INTRAVENOUS
  Administered 2019-09-26: 50 ug/kg/min via INTRAVENOUS
  Administered 2019-09-26: 40 ug/kg/min via INTRAVENOUS
  Administered 2019-09-27: 35 ug/kg/min via INTRAVENOUS
  Administered 2019-09-27: 20 ug/kg/min via INTRAVENOUS
  Administered 2019-09-27 (×2): 35 ug/kg/min via INTRAVENOUS
  Administered 2019-09-28 – 2019-09-29 (×2): 20 ug/kg/min via INTRAVENOUS
  Administered 2019-09-30: 5 ug/kg/min via INTRAVENOUS
  Administered 2019-09-30 (×2): 20 ug/kg/min via INTRAVENOUS
  Administered 2019-10-01: 25 ug/kg/min via INTRAVENOUS
  Filled 2019-09-25 (×10): qty 100
  Filled 2019-09-25: qty 200
  Filled 2019-09-25 (×14): qty 100

## 2019-09-25 NOTE — Progress Notes (Signed)
°  Echocardiogram 2D Echocardiogram has been performed.  [name] M 09/24/2019, 11:23 AM

## 2019-09-25 NOTE — Progress Notes (Addendum)
Pharmacy Antibiotic Note  Brian Guerra is a 69 y.o. male admitted on 10/12/2019 with fevers/possible PNA.  Pharmacy has been consulted for vancomycin dosing.  Patient was diagnosed with COVID on 7/21 at OSH.  S/p remdesivir x 5 days, Decadron x 10 days, and Solu-Medrol x 5 days. On 8/10 patient was transferred to [facility] for continued care on 40L high flow oxygen.  Recent txt for enterococcus UTI and strep PNA.  Patient found to be in severe respiratory distress 8/13 requiring intubation.    WBC 21, Scr 1.26 (uptrending), CrCl 58 mL/min  Plan: Vancomycin 2000mg  loading dose, followed by Vancomycin 750 IV every 12 hours.  Goal trough 15-20 mcg/mL.  Height: 5\' 7"  (170.2 cm) Weight: 84.7 kg (186 lb 11.7 oz) IBW/kg (Calculated) : 66.1  Temp (24hrs), Avg:101.4 F (38.6 C), Min:101.4 F (38.6 C), Max:101.4 F (38.6 C)  Recent Labs  Lab 09/23/19 0717 10/09/2019 0500 10/03/2019 0643  WBC 14.0* 21.9* 25.1*  CREATININE 1.11 1.26* 1.28*    Estimated Creatinine Clearance: 58.2 mL/min (A) (by C-G formula based on SCr of 1.28 mg/dL (H)).    No Known Allergies  Antimicrobials this admission: Cefepime 8/13 >>  Vancomycin 8/13 >>   Microbiology results: 8/10 BCx: ngtd 8/11 Resp Cx: Gm (+) cocci, G (-) rods, pending 8/11 Sputum (@outside  facility): ngtd    Thank you for allowing pharmacy to be a part of this patient's care.  10/11, PharmD PGY-1 Acute Care Pharmacy Resident 10/13/2019 9:27 AM

## 2019-09-25 NOTE — Procedures (Signed)
Central Venous Catheter Insertion Procedure Note  Brian Guerra  [id number]  14-Aug-1950  Date:09/13/2019  Time:12:30 PM   Provider Performing:Patti Shorb Veleta Miners, NP-C  Procedure: Insertion of Non-tunneled Central Venous [phone]) with US guidance ([id number])   Indication(s) Medication administration  Consent Risks of the procedure as well as the alternatives and risks of each were explained to the patient and/or caregiver.  Consent for the procedure was obtained and is signed in the bedside chart  Anesthesia Topical only with 1% lidocaine   Timeout Verified patient identification, verified procedure, site/side was marked, verified correct patient position, special equipment/implants available, medications/allergies/relevant history reviewed, required imaging and test results available.  Sterile Technique Maximal sterile technique including full sterile barrier drape, hand hygiene, sterile gown, sterile gloves, mask, hair covering, sterile ultrasound probe cover (if used).  Procedure Description Area of catheter insertion was cleaned with chlorhexidine and draped in sterile fashion.  With real-time ultrasound guidance a HD catheter was placed into the left internal jugular vein. Nonpulsatile blood flow and easy flushing noted in all ports.  The catheter was sutured in place and sterile dressing applied.  Complications/Tolerance None; patient tolerated the procedure well. Chest X-ray is ordered to verify placement for internal jugular or subclavian cannulation.   Chest x-ray is not ordered for femoral cannulation.  EBL Minimal  Specimen(s) None   Procedure performed under direct supervision of Dr. Vassie Loll and with ultrasound guidance for real time vessel cannulation.       Brian Brim, MSN, NP-C [facility] 09/20/2019, 12:31 PM   Please see [email or web address] for pager details.

## 2019-09-25 NOTE — Progress Notes (Signed)
Lower extremity venous has been completed.   Preliminary results in CV Proc.   Blanch Media 2019-10-24 10:50 AM

## 2019-09-25 NOTE — Progress Notes (Signed)
Sputum culture collected and sent to the lab. 

## 2019-09-25 NOTE — Consult Note (Signed)
Responded to page, pt unavailable, no family present, staff will page again if further chaplain services needed. Pt will be transferred to [facility].  Rev. Donnel Saxon Chaplain

## 2019-09-25 NOTE — H&P (Signed)
NAME:  Brian Guerra, MRN:  [id number], DOB:  [birth year], LOS: 0 ADMISSION DATE:  09/30/2019, CONSULTATION DATE:  09/16/2019 REFERRING MD:  [facility], CHIEF COMPLAINT:  Acute Hypoxic Respiratory Failure    History of present illness   69 year old male, diagnosed with COVID on 7/21 at OSH. Completed 5 days of Remdesivir, 10 days of Decadron, and 5 days of Solu-medrol. Stay complicated by progressive hypoxia, Streptococcal PNA. Completed 5 day course of Azithromycin/Rocephin/Cefdinir. Enterococcus UTI treated with Ampicillin and then changed to amoxicillin. Acute Renal Failure with Rhabdomyolysis. And Transaminitis.   On 8/10 patient was transferred to [facility] for continued care. Patient remained on 40L High Flow Oxygen. On 8/13 patient was found to be in severe respiratory distress requiring intubation. Question aspiration event, patient with emesis before event. Transferred ICU.   Past Medical History  DM, ETOH/Tobacco Use (Quit 15 years ago)   Significant Hospital Events   7/21-8/10 > Admit to OSH  8/13 > Intubated and Transferred to ICU   Consults:  PCCM  Procedures:  ETT 8/13 >>  Significant Diagnostic Tests:  CXR 8/13 >>  Micro Data:  Blood 8/13 >> Urine 8/13 >> Sputum 8/13 >>  Antimicrobials:  Cefepime 8/10 >>   Interim history/subjective:  As above.   Objective   Height 5\' 7"  (1.702 m), weight 89.9 kg, SpO2 94 %.    Vent Mode: PRVC FiO2 (%):  [100 %] 100 % Set Rate:  [26 bmp] 26 bmp Vt Set:  [380 mL] 380 mL PEEP:  [12 cmH20] 12 cmH20 Plateau Pressure:  [30 cmH20] 30 cmH20  No intake or output data in the 24 hours ending 09/16/2019 0553 Filed Weights   09/17/2019 0547  Weight: 89.9 kg    Examination: General: Adult male, respiratory distress  HENT: ETT in place  Lungs: Diminished breath sounds at bases, rhonchi to upper   Cardiovascular: Tachy, no MRG Abdomen: soft, non-tender, active bowel sounds  Extremities: -edema  Neuro: sedated, does not  follow commands, pupils intact  GU: intact   Resolved Hospital Problem list     Assessment & Plan:   Acute Hypoxic Respiratory Distress, Viral PNA secondary to COVID and Strep PNA  Question Aspiration Event Plan -Vent Support -Trend ABG/CXR -Pulmonary Hygiene  -Has Already Completed Remdisivr and Decadron/Solu-medrol Course -Will Continue a tapered course of Solu-Medrol  -Propofol/Fentanyl gtt for RASS goal -4/-5. Given 50 ROC on arrival to unit due to severe tachypnea, hypoxia. Will dose PRN. May need to start gtt   Leucocytosis, Treated for Enterococcus UTI and Strep PNA Plan  -ID following > Recommends Cefepime  -PAN Culture  -Trend WBC and Fever Curve  -Trend PCT   Acute Kidney Injury, suspected secondary to tubular necrosis  Plan -Trend BMP -Obtain Lactic Acid  -May need gentle hydration   DM Plan -Trend Glucose  -SSI    Best practice:  Diet: NPO Pain/Anxiety/Delirium protocol (if indicated VAP protocol (if indicated) DVT prophylaxis: Heparin sq  GI prophylaxis: PPI Glucose control: SSI Mobility: Bedrest  Code Status: FC Family Communication: will update Disposition:   Labs   CBC: Recent Labs  Lab 09/23/19 0717 10/06/2019 0500  WBC 14.0* 21.9*  NEUTROABS 9.7*  --   HGB 12.8* 12.3*  HCT 40.9 38.9*  MCV 90.3 90.0  PLT 401* 423*    Basic Metabolic Panel: Recent Labs  Lab 09/23/19 0717 09/21/2019 0500  NA 131* 130*  K 4.3 4.7  CL 97* 98  CO2 24 23  GLUCOSE 113* 167*  BUN 18 26*  CREATININE 1.11 1.26*  CALCIUM 9.2 8.9   GFR: Estimated Creatinine Clearance: 60.8 mL/min (A) (by C-G formula based on SCr of 1.26 mg/dL (H)). Recent Labs  Lab 09/23/19 0717 09/15/2019 0500  WBC 14.0* 21.9*    Liver Function Tests: Recent Labs  Lab 09/23/19 0717  AST 27  ALT 111*  ALKPHOS 84  BILITOT 1.0  PROT 6.9  ALBUMIN 2.2*   No results for input(s): LIPASE, AMYLASE in the last 168 hours. No results for input(s): AMMONIA in the last 168  hours.  ABG    Component Value Date/Time   PHART 7.279 (L) 10/10/2019 0450   PCO2ART 51.4 (H) 10/12/2019 0450   PO2ART 70.1 (L) 09/21/2019 0450   HCO3 22.8 09/18/2019 0450   ACIDBASEDEF 2.7 (H) 09/30/2019 0450   O2SAT 87.4 10/09/2019 0450     Coagulation Profile: No results for input(s): INR, PROTIME in the last 168 hours.  Cardiac Enzymes: Recent Labs  Lab 09/23/19 0717  CKTOTAL 39*  CKMB 1.2    HbA1C: No results found for: HGBA1C  CBG: No results for input(s): GLUCAP in the last 168 hours.  Review of Systems:   Unable to review as patient is sedated/unresponsive   Past Medical History  He,  has a past medical history of Acute on chronic respiratory failure with hypoxia (HCC), Acute renal failure due to tubular necrosis (HCC), COVID-19 virus infection, Pneumonia due to COVID-19 virus, and Severe sepsis (HCC).   Surgical History   N/a   Social History      Family History   His family history is not on file.   Allergies Not on File   Home Medications  Prior to Admission medications   Not on File     Critical care time: 52 minutes     Jovita Kussmaul, AGACNP-BC [facility]  PCCM Pgr: [phone]

## 2019-09-25 NOTE — Progress Notes (Signed)
Initial Nutrition Assessment  DOCUMENTATION CODES:   Not applicable  INTERVENTION:   Tube feeding:  -Vital AF 1.2 @ 50 ml/hr via OG (1200 ml) -90 ml ProSource TID  Provides: 1680 kcals (2108 kcal with propofol), 156 grams protein, 973 ml free water.   NUTRITION DIAGNOSIS:   Increased nutrient needs related to acute illness as evidenced by estimated needs.  GOAL:   Patient will meet greater than or equal to 90% of their needs  MONITOR:   Diet advancement, Vent status, Skin, TF tolerance, Weight trends, Labs, I & O's  REASON FOR ASSESSMENT:   Ventilator    ASSESSMENT:   Patient with PMH significant for DM, ETOH use, and recent COVID diagnosis on 7/21. On 8/10 pt was transferred to [facility] for continued care and on 8/13 was found to be in acute respiratory distress requiring intubation.   Pt discussed during ICU rounds and with RN.   Remains on propofol. No pressors. Finished remdisivr and decadron. Will discuss feeding with CCM. Xray confirmed OG in stomach.   Records indicate pt weighed 198 lb on 8/10 and 186 lb this admission. Unable to determine dry wt loss from fluid fluctuation. Utilize 84.7 kg as EDW for now. Suspect PCM but will need to obtain nutrition history to confirm.   Patient is currently intubated on ventilator support MV: 10.5 L/min Temp (24hrs), Avg:101.4 F (38.6 C), Min:101.4 F (38.6 C), Max:101.4 F (38.6 C)  Propofol: 16.2 ml/hr- provides 428 kcal from lipids daily   Drips: propofol, sodium bicarb Medications: colace, SS novolog, solumedrol, miralax Labs: Na 134 (L) K 5.2 (H) Phosphorus 5.8 (H)   NUTRITION - FOCUSED PHYSICAL EXAM:    Most Recent Value  Orbital Region Moderate depletion  Upper Arm Region No depletion  Thoracic and Lumbar Region Unable to assess  Buccal Region Unable to assess  Temple Region Severe depletion  Clavicle Bone Region No depletion  Clavicle and Acromion Bone Region Mild depletion  Scapular Bone  Region Unable to assess  Dorsal Hand No depletion  Patellar Region Mild depletion  Anterior Thigh Region Mild depletion  Posterior Calf Region Moderate depletion  Edema (RD Assessment) Moderate  Hair Reviewed  Eyes Unable to assess  Mouth Unable to assess  Skin Reviewed  Nails Reviewed     Diet Order:   Diet Order    None      EDUCATION NEEDS:   Not appropriate for education at this time  Skin:  Skin Assessment: Reviewed RN Assessment  Last BM:  8/13  Height:   Ht Readings from Last 1 Encounters:  10/24/19 5\' 7"  (1.702 m)    Weight:   Wt Readings from Last 1 Encounters:  October 24, 2019 84.7 kg    BMI:  Body mass index is 29.25 kg/m.  Estimated Nutritional Needs:   Kcal:  2077 kcal  Protein:  125-145 grams  Fluid:  >/= 2 L/day   2078 RD, LDN Clinical Nutrition Pager listed in AMION

## 2019-09-25 NOTE — Procedures (Signed)
Arterial Catheter Insertion Procedure Note  Brian Guerra  [id number]  1950-08-13  Date:09/26/2019  Time:6:52 AM    Provider Performing: Berton Bon    Procedure: Insertion of Arterial Line (03212) without US guidance  Indication(s) Blood pressure monitoring and/or need for frequent ABGs  Consent Risks of the procedure as well as the alternatives and risks of each were explained to the patient and/or caregiver.  Consent for the procedure was obtained and is signed in the bedside chart  Anesthesia None   Time Out Verified patient identification, verified procedure, site/side was marked, verified correct patient position, special equipment/implants available, medications/allergies/relevant history reviewed, required imaging and test results available.   Sterile Technique Maximal sterile technique including full sterile barrier drape, hand hygiene, sterile gown, sterile gloves, mask, hair covering, sterile ultrasound probe cover (if used).   Procedure Description Area of catheter insertion was cleaned with chlorhexidine and draped in sterile fashion. With real-time ultrasound guidance an arterial catheter was placed into the left radial artery.  Appropriate arterial tracings confirmed on monitor.     Complications/Tolerance None; patient tolerated the procedure well.   EBL Minimal   Specimen(s) None

## 2019-09-25 NOTE — Progress Notes (Signed)
eLink Physician-Brief Progress Note Patient Name: Brian Guerra DOB: 1950-04-21 MRN: [id number]   Date of Service  10-12-19  HPI/Events of Note  K+ 5.7  eICU Interventions  Lokelma 10 gm  X 1 tonight at midnight.        Migdalia Dk 2019/10/12, 9:32 PM

## 2019-09-25 NOTE — Progress Notes (Signed)
eLink Physician-Brief Progress Note Patient Name: Brian Guerra DOB: 09/29/1950 MRN: [id number]   Date of Service  September 30, 2019  HPI/Events of Note  Patient recently hospitalized at [facility] with acute hypoxemic respiratory failure secondary to Covid pneumonia, he was transferred to [facility] but developed acute respiratory distress last night and was [facility] to [facility] ICU.  eICU Interventions  New Patient Evaluation completed.        Brian Guerra 09/30/19, 6:24 AM

## 2019-09-25 NOTE — Progress Notes (Signed)
Pharmacy Antibiotic Note  Brian Guerra is a 69 y.o. male admitted on 2019-10-18 from [facility] with SOB/VDRFbing treated for fevers and poss PNA .  Pharmacy has been consulted for Cefepime dosing.  Plan: Cefepime 2 g IV q8h  Height: 5\' 7"  (170.2 cm) Weight: 84.7 kg (186 lb 11.7 oz) IBW/kg (Calculated) : 66.1  Temp (24hrs), Avg:101.4 F (38.6 C), Min:101.4 F (38.6 C), Max:101.4 F (38.6 C)  Recent Labs  Lab 09/23/19 0717 Oct 18, 2019 0500  WBC 14.0* 21.9*  CREATININE 1.11 1.26*    Estimated Creatinine Clearance: 59.1 mL/min (A) (by C-G formula based on SCr of 1.26 mg/dL (H)).    Not on File   09/27/19 10-18-19 6:24 AM

## 2019-09-25 NOTE — CV Procedure (Signed)
2D echo attempted, but HR is 145-150. Will try later

## 2019-09-26 ENCOUNTER — Inpatient Hospital Stay (HOSPITAL_COMMUNITY): Payer: Medicare Other

## 2019-09-26 DIAGNOSIS — N179 Acute kidney failure, unspecified: Secondary | ICD-10-CM | POA: Diagnosis not present

## 2019-09-26 DIAGNOSIS — J9601 Acute respiratory failure with hypoxia: Secondary | ICD-10-CM | POA: Diagnosis not present

## 2019-09-26 DIAGNOSIS — R6521 Severe sepsis with septic shock: Secondary | ICD-10-CM | POA: Diagnosis not present

## 2019-09-26 DIAGNOSIS — A419 Sepsis, unspecified organism: Secondary | ICD-10-CM | POA: Diagnosis not present

## 2019-09-26 LAB — BASIC METABOLIC PANEL
Anion gap: 11 (ref 5–15)
Anion gap: 13 (ref 5–15)
BUN: 41 mg/dL — ABNORMAL HIGH (ref 8–23)
BUN: 53 mg/dL — ABNORMAL HIGH (ref 8–23)
CO2: 25 mmol/L (ref 22–32)
CO2: 24 mmol/L (ref 22–32)
Calcium: 9.1 mg/dL (ref 8.9–10.3)
Calcium: 8.6 mg/dL — ABNORMAL LOW (ref 8.9–10.3)
Chloride: 99 mmol/L (ref 98–111)
Chloride: 98 mmol/L (ref 98–111)
Creatinine, Ser: 2.33 mg/dL — ABNORMAL HIGH (ref 0.61–1.24)
Creatinine, Ser: 2.53 mg/dL — ABNORMAL HIGH (ref 0.61–1.24)
GFR calc Af Amer: 32 mL/min — ABNORMAL LOW (ref >60)
GFR calc Af Amer: 29 mL/min — ABNORMAL LOW (ref >60)
GFR calc non Af Amer: 28 mL/min — ABNORMAL LOW (ref >60)
GFR calc non Af Amer: 25 mL/min — ABNORMAL LOW (ref >60)
Glucose, Bld: 200 mg/dL — ABNORMAL HIGH (ref 70–99)
Glucose, Bld: 200 mg/dL — ABNORMAL HIGH (ref 70–99)
Potassium: 5.8 mmol/L — ABNORMAL HIGH (ref 3.5–5.1)
Potassium: 5.1 mmol/L (ref 3.5–5.1)
Sodium: 135 mmol/L (ref 135–145)
Sodium: 135 mmol/L (ref 135–145)

## 2019-09-26 LAB — POCT I-STAT 7, (LYTES, BLD GAS, ICA,H+H)
Acid-Base Excess: 4 mmol/L — ABNORMAL HIGH (ref 0.0–2.0)
Acid-base deficit: 1 mmol/L (ref 0.0–2.0)
Acid-base deficit: 2 mmol/L (ref 0.0–2.0)
Acid-base deficit: 3 mmol/L — ABNORMAL HIGH (ref 0.0–2.0)
Bicarbonate: 25.5 mmol/L (ref 20.0–28.0)
Bicarbonate: 28.4 mmol/L — ABNORMAL HIGH (ref 20.0–28.0)
Bicarbonate: 29.6 mmol/L — ABNORMAL HIGH (ref 20.0–28.0)
Bicarbonate: 31.9 mmol/L — ABNORMAL HIGH (ref 20.0–28.0)
Calcium, Ion: 1.14 mmol/L — ABNORMAL LOW (ref 1.15–1.40)
Calcium, Ion: 1.21 mmol/L (ref 1.15–1.40)
Calcium, Ion: 1.21 mmol/L (ref 1.15–1.40)
Calcium, Ion: 1.32 mmol/L (ref 1.15–1.40)
HCT: 32 % — ABNORMAL LOW (ref 39.0–52.0)
HCT: 34 % — ABNORMAL LOW (ref 39.0–52.0)
HCT: 36 % — ABNORMAL LOW (ref 39.0–52.0)
HCT: 37 % — ABNORMAL LOW (ref 39.0–52.0)
Hemoglobin: 10.9 g/dL — ABNORMAL LOW (ref 13.0–17.0)
Hemoglobin: 11.6 g/dL — ABNORMAL LOW (ref 13.0–17.0)
Hemoglobin: 12.2 g/dL — ABNORMAL LOW (ref 13.0–17.0)
Hemoglobin: 12.6 g/dL — ABNORMAL LOW (ref 13.0–17.0)
O2 Saturation: 100 %
O2 Saturation: 96 %
O2 Saturation: 96 %
O2 Saturation: 97 %
Patient temperature: 37
Patient temperature: 97.7
Patient temperature: 98.4
Patient temperature: 99
Potassium: 4.5 mmol/L (ref 3.5–5.1)
Potassium: 4.9 mmol/L (ref 3.5–5.1)
Potassium: 5.4 mmol/L — ABNORMAL HIGH (ref 3.5–5.1)
Potassium: 5.6 mmol/L — ABNORMAL HIGH (ref 3.5–5.1)
Sodium: 135 mmol/L (ref 135–145)
Sodium: 135 mmol/L (ref 135–145)
Sodium: 135 mmol/L (ref 135–145)
Sodium: 136 mmol/L (ref 135–145)
TCO2: 27 mmol/L (ref 22–32)
TCO2: 31 mmol/L (ref 22–32)
TCO2: 32 mmol/L (ref 22–32)
TCO2: 34 mmol/L — ABNORMAL HIGH (ref 22–32)
pCO2 arterial: 58.3 mmHg — ABNORMAL HIGH (ref 32.0–48.0)
pCO2 arterial: 64.6 mmHg — ABNORMAL HIGH (ref 32.0–48.0)
pCO2 arterial: 76.8 mmHg (ref 32.0–48.0)
pCO2 arterial: 79.9 mmHg (ref 32.0–48.0)
pH, Arterial: 7.175 — CL (ref 7.350–7.450)
pH, Arterial: 7.175 — CL (ref 7.350–7.450)
pH, Arterial: 7.248 — ABNORMAL LOW (ref 7.350–7.450)
pH, Arterial: 7.302 — ABNORMAL LOW (ref 7.350–7.450)
pO2, Arterial: 101 mmHg (ref 83.0–108.0)
pO2, Arterial: 102 mmHg (ref 83.0–108.0)
pO2, Arterial: 221 mmHg — ABNORMAL HIGH (ref 83.0–108.0)
pO2, Arterial: 92 mmHg (ref 83.0–108.0)

## 2019-09-26 LAB — TRIGLYCERIDES: Triglycerides: 161 mg/dL — ABNORMAL HIGH (ref <150)

## 2019-09-26 LAB — GLUCOSE, CAPILLARY
Glucose-Capillary: 147 mg/dL — ABNORMAL HIGH (ref 70–99)
Glucose-Capillary: 183 mg/dL — ABNORMAL HIGH (ref 70–99)
Glucose-Capillary: 192 mg/dL — ABNORMAL HIGH (ref 70–99)
Glucose-Capillary: 207 mg/dL — ABNORMAL HIGH (ref 70–99)
Glucose-Capillary: 212 mg/dL — ABNORMAL HIGH (ref 70–99)

## 2019-09-26 LAB — CULTURE, RESPIRATORY W GRAM STAIN: Culture: NORMAL

## 2019-09-26 LAB — CBC
HCT: 35.2 % — ABNORMAL LOW (ref 39.0–52.0)
Hemoglobin: 10.9 g/dL — ABNORMAL LOW (ref 13.0–17.0)
MCH: 29.4 pg (ref 26.0–34.0)
MCHC: 31 g/dL (ref 30.0–36.0)
MCV: 94.9 fL (ref 80.0–100.0)
Platelets: 351 10*3/uL (ref 150–400)
RBC: 3.71 MIL/uL — ABNORMAL LOW (ref 4.22–5.81)
RDW: 14 % (ref 11.5–15.5)
WBC: 17 10*3/uL — ABNORMAL HIGH (ref 4.0–10.5)
nRBC: 0 % (ref 0.0–0.2)

## 2019-09-26 LAB — MAGNESIUM: Magnesium: 2.2 mg/dL (ref 1.7–2.4)

## 2019-09-26 LAB — PHOSPHORUS: Phosphorus: 8.7 mg/dL — ABNORMAL HIGH (ref 2.5–4.6)

## 2019-09-26 LAB — PROCALCITONIN: Procalcitonin: 30.49 ng/mL

## 2019-09-26 MED ORDER — FENTANYL CITRATE (PF) 100 MCG/2ML IJ SOLN
INTRAMUSCULAR | Status: AC
  Filled 2019-09-26: qty 2

## 2019-09-26 NOTE — Progress Notes (Signed)
NAME:  Brian Guerra, MRN:  [id number], DOB:  04-30-1950, LOS: 1 ADMISSION DATE:  Oct 25, 2019, CONSULTATION DATE:  10-25-19 REFERRING MD:  [facility], CHIEF COMPLAINT:  Acute Hypoxic Respiratory Failure    History of present illness   69 year old male, diagnosed with COVID on 7/21 at OSH. Completed 5 days of Remdesivir, 10 days of Decadron, and 5 days of Solu-medrol. Stay complicated by progressive hypoxia, Streptococcal PNA. Completed 5 day course of Azithromycin/Rocephin/Cefdinir. Enterococcus UTI treated with Ampicillin and then changed to amoxicillin. Acute Renal Failure with Rhabdomyolysis. And Transaminitis.   On 8/10 patient was transferred to [facility] for continued care. Patient remained on 40L High Flow Oxygen. On 8/13 patient was found to be in severe respiratory distress requiring intubation. Question aspiration event, patient with emesis before event. Transferred ICU.   Past Medical History  DM, ETOH/Tobacco Use (Quit 15 years ago)   Significant Hospital Events   7/21-8/10 > Admit to OSH  8/13 > Intubated and Transferred to ICU   Consults:  PCCM  Procedures:  ETT 8/13 >>  Significant Diagnostic Tests:  CXR 8/14> diffuse bilateral ASD   Micro Data:  Blood 8/13 >> Urine 8/13 >> Sputum 8/13 >> abundant WBC, moderate GPC, rare yeast   Antimicrobials:  Cefepime 8/12>  Vanc 8/13> Interim history/subjective:  Intermittent desaturations on PEEP 10 FiO2 80%  Objective   Blood pressure 138/85, pulse (!) 102, temperature 97.8 F (36.6 C), temperature source Oral, resp. rate (!) 37, height 5\' 7"  (1.702 m), weight 85.8 kg, SpO2 93 %.    Vent Mode: PRVC FiO2 (%):  [80 %-100 %] 80 % Set Rate:  [0.8 bmp-35 bmp] 0.8 bmp Vt Set:  [420 mL] 420 mL PEEP:  [10 cmH20-12 cmH20] 10 cmH20 Plateau Pressure:  [28 cmH20-32 cmH20] 28 cmH20   Intake/Output Summary (Last 24 hours) at 09/26/2019 1309 Last data filed at 09/26/2019 1200 Gross per 24 hour  Intake 4120.2 ml   Output 840 ml  Net 3280.2 ml   Filed Weights   10-25-2019 0547 2019-10-25 0600 09/26/19 0452  Weight: 89.9 kg 84.7 kg 85.8 kg    Examination: General: Critically ill appearing M, intubated lightly sedated NAD HENT: ETT OGT secure. Trachea midline. Pink mmm  Lungs: Symmetrical chest expansion. Tachypnea on MV. Bilaterally crackles, rhonchi Cardiovascular: tachycardic rate regular rhythm. s1s2 no rgm cap refill brisk  Abdomen: Soft round mild distention. Hypoactive bowel sounds  Extremities: Symmetrical bulk and tone no edema no cyanosis no clubbing  Neuro: Drowsy, awakens to voice, nodding yes/no to questions. Following simple commands BUE BLE  GU: WNL   Resolved Hospital Problem list     Assessment & Plan:   Acute Hypoxic Respiratory Distress, Viral PNA secondary to COVID-19 and Strep PNA  Question Aspiration Event -Has Already Completed Remdisivr and Decadron/Solu-medrol Course -CXR 8/14 with diffuse bilateral ASD  Plan -remaining on airborne/contact precautions  -Check ABG, continue to trend -incr PEEP to 12, FiO2 100% -Incr sedation: prop gtt and fent gtt. Depending on response to adjustments/ABG, may need to consider prone  -Pulmonary Hygiene  -Will Continue a tapered course of Solu-Medrol  -May benefit from diuresis however somewhat limited by AKI at present and will defer 8/14   Shock, improving Sepsis: Leukocytosis, recently treated for Enterococcus UTI and Strep PNA Plan  -ID consulted 8/12- started on cefepime at that time -expanded to vanc, cefepime 8/13 given decompensation -Trend WBC and Fever Curve  -follow up cx data  -Weaning off levophed   Acute  Kidney Injury, suspected secondary to tubular necrosis  Plan -trend renal indices, I/O -on bicarb gtt   DM Plan -Trend Glucose  -SSI    Best practice:  Diet: NPO Pain/Anxiety/Delirium protocol (if indicated): propofol, fentanyl gtt  VAP protocol (if indicated):yes  DVT prophylaxis: Heparin sq  GI  prophylaxis: PPI Glucose control: SSI Mobility: Bedrest  Code Status: Full  Family Communication: Pending 8/14 Disposition: ICU   Labs   CBC: Recent Labs  Lab 09/23/19 0717 09/23/19 0717 10-12-2019 0500 10-12-19 0500 10/12/2019 0643 10-12-2019 1019 2019-10-12 1610 Oct 12, 2019 1950 09/26/19 0005 09/26/19 0218 09/26/19 0303  WBC 14.0*  --  21.9*  --  25.1*  --   --   --   --  17.0*  --   NEUTROABS 9.7*  --   --   --   --   --   --   --   --   --   --   HGB 12.8*   < > 12.3*   < > 13.0   < > 12.6* 12.2* 12.2* 10.9* 11.6*  HCT 40.9   < > 38.9*   < > 43.1   < > 37.0* 36.0* 36.0* 35.2* 34.0*  MCV 90.3  --  90.0  --  93.7  --   --   --   --  94.9  --   PLT 401*  --  423*  --  467*  --   --   --   --  351  --    < > = values in this interval not displayed.    Basic Metabolic Panel: Recent Labs  Lab 10-12-2019 0500 10/12/2019 0500 October 12, 2019 0643 2019/10/12 1019 Oct 12, 2019 1641 12-Oct-2019 1641 Oct 12, 2019 1950 10/12/2019 2044 09/26/19 0005 09/26/19 0218 09/26/19 0303  NA 130*   < > 134*   < > 135   < > 134* 136 136 135 135  K 4.7   < > 5.2*   < > 5.8*   < > 5.7* 5.9* 5.4* 5.1 4.9  CL 98  --  98  --  99  --   --  99  --  98  --   CO2 23  --  27  --  25  --   --  26  --  24  --   GLUCOSE 167*  --  207*  --  200*  --   --  169*  --  200*  --   BUN 26*  --  28*  --  41*  --   --  45*  --  53*  --   CREATININE 1.26*  --  1.28*  --  2.33*  --   --  2.44*  --  2.53*  --   CALCIUM 8.9  --  9.5  --  9.1  --   --  8.9  --  8.6*  --   MG  --   --  2.3  --   --   --   --   --   --  2.2  --   PHOS  --   --  5.8*  --   --   --   --   --   --  8.7*  --    < > = values in this interval not displayed.   GFR: Estimated Creatinine Clearance: 29.7 mL/min (A) (by C-G formula based on SCr of 2.53 mg/dL (H)). Recent Labs  Lab 09/23/19 0717 10/12/19 0500 10/12/19 8242 09/26/19  3419  PROCALCITON  --   --  3.18 30.49  WBC 14.0* 21.9* 25.1* 17.0*    Liver Function Tests: Recent Labs  Lab 09/23/19 0717  10/02/2019 0643  AST 27 70*  ALT 111* 157*  ALKPHOS 84 148*  BILITOT 1.0 1.0  PROT 6.9 7.6  ALBUMIN 2.2* 2.1*   No results for input(s): LIPASE, AMYLASE in the last 168 hours. No results for input(s): AMMONIA in the last 168 hours.  ABG    Component Value Date/Time   PHART 7.248 (L) 09/26/2019 0303   PCO2ART 58.3 (H) 09/26/2019 0303   PO2ART 102 09/26/2019 0303   HCO3 25.5 09/26/2019 0303   TCO2 27 09/26/2019 0303   ACIDBASEDEF 3.0 (H) 09/26/2019 0303   O2SAT 97.0 09/26/2019 0303     Coagulation Profile: No results for input(s): INR, PROTIME in the last 168 hours.  Cardiac Enzymes: Recent Labs  Lab 09/23/19 0717 09/23/2019 0643  CKTOTAL 39* 124  CKMB 1.2  --     HbA1C: Hgb A1c MFr Bld  Date/Time Value Ref Range Status  09/24/2019 06:43 AM 6.7 (H) 4.8 - 5.6 % Final    Comment:    (NOTE) Pre diabetes:          5.7%-6.4%  Diabetes:              >6.4%  Glycemic control for   <7.0% adults with diabetes     CBG: Recent Labs  Lab 10/02/2019 1605 10/08/2019 1935 09/18/2019 2359 09/26/19 0355 09/26/19 1254  GLUCAP 188* 153* 147* 183* 192*    CRITICAL CARE Performed by: Lanier Clam   Total critical care time: 40 minutes  Critical care time was exclusive of separately billable procedures and treating other patients. Critical care was necessary to treat or prevent imminent or life-threatening deterioration.  Critical care was time spent personally by me on the following activities: development of treatment plan with patient and/or surrogate as well as nursing, discussions with consultants, evaluation of patient's response to treatment, examination of patient, obtaining history from patient or surrogate, ordering and performing treatments and interventions, ordering and review of laboratory studies, ordering and review of radiographic studies, pulse oximetry and re-evaluation of patient's condition.   Tessie Fass MSN, AGACNP-BC [facility]  [facility] [phone] If no answer, [phone] 09/26/2019, 1:10 PM

## 2019-09-26 NOTE — Progress Notes (Signed)
eLink Physician-Brief Progress Note Patient Name: Brian Guerra DOB: 05-Jan-1951 MRN: [id number]   Date of Service  09/26/2019  HPI/Events of Note  ABG with marked respiratory acidosis, respiratory rate is already 35 and Tidal volume is 7 ml / kg, PO2 101 and PEEP is 12.  eICU Interventions  PEEP reduced to 10 in an attempt to improve minute ventilation modestly. ABG will be obtained 1 hour after the change.        Rahim Astorga U Tahja Liao 09/26/2019, 1:08 AM

## 2019-09-27 ENCOUNTER — Inpatient Hospital Stay (HOSPITAL_COMMUNITY): Payer: Medicare Other

## 2019-09-27 DIAGNOSIS — U071 COVID-19: Secondary | ICD-10-CM | POA: Diagnosis not present

## 2019-09-27 DIAGNOSIS — J80 Acute respiratory distress syndrome: Secondary | ICD-10-CM | POA: Diagnosis not present

## 2019-09-27 LAB — MAGNESIUM
Magnesium: 2.7 mg/dL — ABNORMAL HIGH (ref 1.7–2.4)
Magnesium: 2.6 mg/dL — ABNORMAL HIGH (ref 1.7–2.4)

## 2019-09-27 LAB — POCT I-STAT 7, (LYTES, BLD GAS, ICA,H+H)
Acid-Base Excess: 3 mmol/L — ABNORMAL HIGH (ref 0.0–2.0)
Bicarbonate: 29.2 mmol/L — ABNORMAL HIGH (ref 20.0–28.0)
Calcium, Ion: 1.2 mmol/L (ref 1.15–1.40)
HCT: 30 % — ABNORMAL LOW (ref 39.0–52.0)
Hemoglobin: 10.2 g/dL — ABNORMAL LOW (ref 13.0–17.0)
O2 Saturation: 90 %
Patient temperature: 98.7
Potassium: 4.1 mmol/L (ref 3.5–5.1)
Sodium: 137 mmol/L (ref 135–145)
TCO2: 31 mmol/L (ref 22–32)
pCO2 arterial: 54.2 mmHg — ABNORMAL HIGH (ref 32.0–48.0)
pH, Arterial: 7.339 — ABNORMAL LOW (ref 7.350–7.450)
pO2, Arterial: 64 mmHg — ABNORMAL LOW (ref 83.0–108.0)

## 2019-09-27 LAB — GLUCOSE, CAPILLARY
Glucose-Capillary: 169 mg/dL — ABNORMAL HIGH (ref 70–99)
Glucose-Capillary: 175 mg/dL — ABNORMAL HIGH (ref 70–99)
Glucose-Capillary: 193 mg/dL — ABNORMAL HIGH (ref 70–99)
Glucose-Capillary: 209 mg/dL — ABNORMAL HIGH (ref 70–99)
Glucose-Capillary: 209 mg/dL — ABNORMAL HIGH (ref 70–99)

## 2019-09-27 LAB — COMPREHENSIVE METABOLIC PANEL
ALT: 101 U/L — ABNORMAL HIGH (ref 0–44)
AST: 29 U/L (ref 15–41)
Albumin: 1.6 g/dL — ABNORMAL LOW (ref 3.5–5.0)
Alkaline Phosphatase: 103 U/L (ref 38–126)
Anion gap: 12 (ref 5–15)
BUN: 79 mg/dL — ABNORMAL HIGH (ref 8–23)
CO2: 25 mmol/L (ref 22–32)
Calcium: 8.6 mg/dL — ABNORMAL LOW (ref 8.9–10.3)
Chloride: 98 mmol/L (ref 98–111)
Creatinine, Ser: 2.6 mg/dL — ABNORMAL HIGH (ref 0.61–1.24)
GFR calc Af Amer: 28 mL/min — ABNORMAL LOW (ref >60)
GFR calc non Af Amer: 24 mL/min — ABNORMAL LOW (ref >60)
Glucose, Bld: 230 mg/dL — ABNORMAL HIGH (ref 70–99)
Potassium: 4.1 mmol/L (ref 3.5–5.1)
Sodium: 135 mmol/L (ref 135–145)
Total Bilirubin: 0.7 mg/dL (ref 0.3–1.2)
Total Protein: 6.1 g/dL — ABNORMAL LOW (ref 6.5–8.1)

## 2019-09-27 LAB — CULTURE, BLOOD (ROUTINE X 2)
Culture: NO GROWTH
Culture: NO GROWTH

## 2019-09-27 LAB — CBC
HCT: 33.3 % — ABNORMAL LOW (ref 39.0–52.0)
Hemoglobin: 10.3 g/dL — ABNORMAL LOW (ref 13.0–17.0)
MCH: 28.8 pg (ref 26.0–34.0)
MCHC: 30.9 g/dL (ref 30.0–36.0)
MCV: 93 fL (ref 80.0–100.0)
Platelets: 350 10*3/uL (ref 150–400)
RBC: 3.58 MIL/uL — ABNORMAL LOW (ref 4.22–5.81)
RDW: 14 % (ref 11.5–15.5)
WBC: 17.4 10*3/uL — ABNORMAL HIGH (ref 4.0–10.5)
nRBC: 0.3 % — ABNORMAL HIGH (ref 0.0–0.2)

## 2019-09-27 LAB — CULTURE, RESPIRATORY W GRAM STAIN: Culture: NORMAL

## 2019-09-27 LAB — TRIGLYCERIDES: Triglycerides: 256 mg/dL — ABNORMAL HIGH (ref <150)

## 2019-09-27 LAB — PHOSPHORUS
Phosphorus: 5.8 mg/dL — ABNORMAL HIGH (ref 2.5–4.6)
Phosphorus: 6.1 mg/dL — ABNORMAL HIGH (ref 2.5–4.6)

## 2019-09-27 LAB — PROCALCITONIN: Procalcitonin: 18.94 ng/mL

## 2019-09-27 MED ORDER — MIDAZOLAM BOLUS VIA INFUSION
1.0000 mg | INTRAVENOUS | Status: DC | PRN
  Administered 2019-09-30: 2 mg via INTRAVENOUS
  Administered 2019-09-30: 1 mg via INTRAVENOUS
  Administered 2019-10-01: 2 mg via INTRAVENOUS
  Filled 2019-09-27: qty 2

## 2019-09-27 MED ORDER — MIDAZOLAM 50MG/50ML (1MG/ML) PREMIX INFUSION
0.0000 mg/h | INTRAVENOUS | Status: DC
  Administered 2019-09-27 (×2): 2 mg/h via INTRAVENOUS
  Administered 2019-09-28 – 2019-09-29 (×2): 5 mg/h via INTRAVENOUS
  Administered 2019-09-29: 4 mg/h via INTRAVENOUS
  Administered 2019-09-29 – 2019-10-01 (×4): 5 mg/h via INTRAVENOUS
  Filled 2019-09-27 (×9): qty 50

## 2019-09-27 MED ORDER — FUROSEMIDE 10 MG/ML IJ SOLN
40.0000 mg | Freq: Once | INTRAMUSCULAR | Status: AC
  Administered 2019-09-27: 40 mg via INTRAVENOUS
  Filled 2019-09-27: qty 4

## 2019-09-27 MED ORDER — NOREPINEPHRINE 16 MG/250ML-% IV SOLN
0.0000 ug/min | INTRAVENOUS | Status: DC
  Administered 2019-09-27 (×2): 2 ug/min via INTRAVENOUS
  Administered 2019-09-29: 12 ug/min via INTRAVENOUS
  Filled 2019-09-27 (×4): qty 250

## 2019-09-27 MED ORDER — SODIUM CHLORIDE 0.9 % IV SOLN
2.0000 g | INTRAVENOUS | Status: DC
  Administered 2019-09-28: 2 g via INTRAVENOUS
  Filled 2019-09-27: qty 2

## 2019-09-27 NOTE — Progress Notes (Signed)
eLink Physician-Brief Progress Note Patient Name: Brian Guerra DOB: May 07, 1950 MRN: [id number]   Date of Service  09/27/2019  HPI/Events of Note  Multiple loose stools. Request for Flexiseal.   eICU Interventions  Will order Flexiseal.      Intervention Category Major Interventions: Other:  [name] 09/27/2019, 9:40 PM

## 2019-09-27 NOTE — Progress Notes (Signed)
Pharmacy Antibiotic Note  Brian Guerra is a 69 y.o. male admitted on 10/06/2019 with fevers/possible PNA.  Pharmacy has been consulted for cefepime dosing.  Patient was diagnosed with COVID on 7/21 at OSH.  S/p remdesivir x 5 days, Decadron x 10 days, and Solu-Medrol x 5 days. On 8/10 patient was transferred to [facility] for continued care on 40L high flow oxygen.  Recent txt for enterococcus UTI and strep PNA.  Patient found to be in severe respiratory distress 8/13 requiring intubation.    Vancomycin stopped today. WBC down to 17, Scr up to 2.6, CrCl 58>29 ml/min. No change in CXR.  Plan: Adjust Cefepime to 2g IV q24 hrs for worsening renal function Stop Vancomycin Monitor renal function, cultures/sensitivities, clinical progression  Height: 5\' 7"  (170.2 cm) Weight: 89.6 kg (197 lb 8.5 oz) IBW/kg (Calculated) : 66.1  Temp (24hrs), Avg:98.1 F (36.7 C), Min:97.8 F (36.6 C), Max:98.8 F (37.1 C)  Recent Labs  Lab 09/23/19 0717 09/23/19 0717 10/12/2019 0500 09/26/2019 0500 09/20/2019 0643 09/15/2019 1641 09/27/2019 2044 09/26/19 0218 09/27/19 0412  WBC 14.0*  --  21.9*  --  25.1*  --   --  17.0* 17.4*  CREATININE 1.11   < > 1.26*   < > 1.28* 2.33* 2.44* 2.53* 2.60*   < > = values in this interval not displayed.    Estimated Creatinine Clearance: 29.4 mL/min (A) (by C-G formula based on SCr of 2.6 mg/dL (H)).    No Known Allergies  Antimicrobials this admission: Cefepime 8/13 >>  Vancomycin 8/13 >> 8/15  Microbiology results: 8/10 BCx: ngtd 8/11 Resp Cx: normal resp flora 8/11 Sputum (@outside  facility): ngtd    10/11, PharmD PGY2 Cardiology Pharmacy Resident Phone: [phone] 09/27/2019  2:31 PM  Please check [email or web address] for unit-specific pharmacy phone numbers.

## 2019-09-27 NOTE — Progress Notes (Addendum)
NAME:  Brian Guerra, MRN:  [id number], DOB:  [birth year], LOS: 2 ADMISSION DATE:  10/13/2019, CONSULTATION DATE:  10/13/2019 REFERRING MD:  [facility], CHIEF COMPLAINT:  Acute Hypoxic Respiratory Failure    History of present illness   69 year old male, diagnosed with COVID on 7/21 at OSH. Completed 5 days of Remdesivir, 10 days of Decadron, and 5 days of Solu-medrol. Stay complicated by progressive hypoxia, Streptococcal PNA. Completed 5 day course of Azithromycin/Rocephin/Cefdinir. Enterococcus UTI treated with Ampicillin and then changed to amoxicillin. Acute Renal Failure with Rhabdomyolysis. And Transaminitis.   On 8/10 patient was transferred to [facility] for continued care. Patient remained on 40L High Flow Oxygen. On 8/13 patient was found to be in severe respiratory distress requiring intubation. Question aspiration event, patient with emesis before event. Transferred ICU.   Past Medical History  DM, ETOH/Tobacco Use (Quit 15 years ago)   Significant Hospital Events   7/21-8/10 > Admit to OSH  8/13 > Intubated and Transferred to ICU   Consults:  PCCM  Procedures:  ETT 8/13 >>  Significant Diagnostic Tests:  CXR 8/14> diffuse bilateral ASD   Micro Data:  Blood 8/13 >> Urine 8/13 >> Sputum 8/13 >> NF  Antimicrobials:  Cefepime 8/12>  Vanc 8/13>8/15 Interim history/subjective:  Good saturations on FIO2 100% PEEP12. Intermittently agitated  Objective   Blood pressure 103/72, pulse 93, temperature 97.8 F (36.6 C), temperature source Oral, resp. rate (!) 42, height 5\' 7"  (1.702 m), weight 89.6 kg, SpO2 100 %.    Vent Mode: PRVC FiO2 (%):  [100 %] 100 % Set Rate:  [35 bmp] 35 bmp Vt Set:  [420 mL] 420 mL PEEP:  [12 cmH20] 12 cmH20   Intake/Output Summary (Last 24 hours) at 09/27/2019 1303 Last data filed at 09/27/2019 1100 Gross per 24 hour  Intake 2364.99 ml  Output 1475 ml  Net 889.99 ml   Filed Weights   10/13/19 0600 09/26/19 0452 09/27/19 0500   Weight: 84.7 kg 85.8 kg 89.6 kg   Physical Exam: General: Critically ill appearing, sedated HENT: [place], AT, ETT in place Eyes: EOMI, no scleral icterus Respiratory: Coarse breath sounds bilaterally Cardiovascular: RRR, -M/R/G, no JVD GI: BS+, soft, nontender Extremities:1+non-pitting edema in extremities Neuro: Sedated, grimaces to pain, moves extremities x4 spontaneously  CXR 8/15 -bilateral infiltrates R>L, unchanged Imaging, labs and test noted above have been reviewed independently by me.  Resolved Hospital Problem list     Assessment & Plan:   ARDS/Acute hypoxemic respiratory failure secondary COVID pneumonia and Strep PNA (OSH Cx) Sequelae of COVID-19 pneumonia  S/p Ceftriaxone, Cedfinir, azithro x 5 days at OSH S/p remdesivir, decadron and solumedrol at OSH --Vent settings adjusted as follows: FIO2 decreased to 90% TV 7cc/kg PEEP increased to 12 --Repeat ABG after RASS goal -4 reached --PAD protocol with goal RASS -4: Propofol and fentanyl gtt --Continue broadspectrum abx for possible co-comitant bacterial pneumonia --Continue taper for solumedrol for COVID --Cautious diuresis. Give lasix 40 mg once --VAP   Septic shock 2/2 COVID PNA - improving Recently treated for Enterococcus UTI and Strep PNA at OSH Off pressors Echo demonstrate cor pulmonale --F/u culture data --Broad spectrum abx  Acute Kidney Injury, suspected secondary to tubular necrosis  Plan -trend renal indices, I/O -on bicarb gtt   DM Plan -Trend Glucose  -SSI    Best practice:  Diet: TF Pain/Anxiety/Delirium protocol (if indicated): propofol, fentanyl gtt  VAP protocol (if indicated):yes  DVT prophylaxis: Heparin sq  GI prophylaxis: PPI Glucose  control: SSI Mobility: Bedrest  Code Status: Full  Family Communication:  Disposition: ICU   Labs   CBC: Recent Labs  Lab 09/23/19 0717 09/23/19 0717 27-Sep-2019 0500 27-Sep-2019 0500 09/27/19 0643 09-27-2019 1019 09/26/19 0005 09/26/19 0218  09/26/19 0303 09/26/19 1454 09/27/19 0412  WBC 14.0*  --  21.9*  --  25.1*  --   --  17.0*  --   --  17.4*  NEUTROABS 9.7*  --   --   --   --   --   --   --   --   --   --   HGB 12.8*   < > 12.3*   < > 13.0   < > 12.2* 10.9* 11.6* 10.9* 10.3*  HCT 40.9   < > 38.9*   < > 43.1   < > 36.0* 35.2* 34.0* 32.0* 33.3*  MCV 90.3  --  90.0  --  93.7  --   --  94.9  --   --  93.0  PLT 401*  --  423*  --  467*  --   --  351  --   --  350   < > = values in this interval not displayed.    Basic Metabolic Panel: Recent Labs  Lab September 27, 2019 0643 September 27, 2019 1019 September 27, 2019 1641 2019/09/27 1951 09-27-19 2044 09/27/2019 2044 09/26/19 0005 09/26/19 0218 09/26/19 0303 09/26/19 1454 09/27/19 0412  NA 134*   < > 135   < > 136   < > 136 135 135 135 135  K 5.2*   < > 5.8*   < > 5.9*   < > 5.4* 5.1 4.9 4.5 4.1  CL 98  --  99  --  99  --   --  98  --   --  98  CO2 27  --  25  --  26  --   --  24  --   --  25  GLUCOSE 207*  --  200*  --  169*  --   --  200*  --   --  230*  BUN 28*  --  41*  --  45*  --   --  53*  --   --  79*  CREATININE 1.28*  --  2.33*  --  2.44*  --   --  2.53*  --   --  2.60*  CALCIUM 9.5  --  9.1  --  8.9  --   --  8.6*  --   --  8.6*  MG 2.3  --   --   --   --   --   --  2.2  --   --   --   PHOS 5.8*  --   --   --   --   --   --  8.7*  --   --   --    < > = values in this interval not displayed.   GFR: Estimated Creatinine Clearance: 29.4 mL/min (A) (by C-G formula based on SCr of 2.6 mg/dL (H)). Recent Labs  Lab September 27, 2019 0500 09-27-19 0643 09/26/19 0218 09/27/19 0412  PROCALCITON  --  3.18 30.49 18.94  WBC 21.9* 25.1* 17.0* 17.4*    Liver Function Tests: Recent Labs  Lab 09/23/19 0717 27-Sep-2019 0643 09/27/19 0412  AST 27 70* 29  ALT 111* 157* 101*  ALKPHOS 84 148* 103  BILITOT 1.0 1.0 0.7  PROT 6.9 7.6 6.1*  ALBUMIN 2.2* 2.1* 1.6*  No results for input(s): LIPASE, AMYLASE in the last 168 hours. No results for input(s): AMMONIA in the last 168 hours.  ABG     Component Value Date/Time   PHART 7.302 (L) 09/26/2019 1454   PCO2ART 64.6 (H) 09/26/2019 1454   PO2ART 92 09/26/2019 1454   HCO3 31.9 (H) 09/26/2019 1454   TCO2 34 (H) 09/26/2019 1454   ACIDBASEDEF 3.0 (H) 09/26/2019 0303   O2SAT 96.0 09/26/2019 1454     Coagulation Profile: No results for input(s): INR, PROTIME in the last 168 hours.  Cardiac Enzymes: Recent Labs  Lab 09/23/19 0717 09/16/2019 0643  CKTOTAL 39* 124  CKMB 1.2  --     HbA1C: Hgb A1c MFr Bld  Date/Time Value Ref Range Status  10/03/2019 06:43 AM 6.7 (H) 4.8 - 5.6 % Final    Comment:    (NOTE) Pre diabetes:          5.7%-6.4%  Diabetes:              >6.4%  Glycemic control for   <7.0% adults with diabetes     CBG: Recent Labs  Lab 09/26/19 1556 09/26/19 2023 09/27/19 0021 09/27/19 0412 09/27/19 1103  GLUCAP 212* 207* 209* 209* 169*   The patient is critically ill with multiple organ systems failure and requires high complexity decision making for assessment and support, frequent evaluation and titration of therapies, application of advanced monitoring technologies and extensive interpretation of multiple databases.  Independent Critical Care Time: 45 Minutes.   Mechele Collin, M.D. [facility] Pulmonary/Critical Care Medicine 09/27/2019 1:03 PM   Please see Amion for pager number to reach on-call Pulmonary and Critical Care Team.

## 2019-09-28 ENCOUNTER — Inpatient Hospital Stay (HOSPITAL_COMMUNITY): Payer: Medicare Other

## 2019-09-28 DIAGNOSIS — I469 Cardiac arrest, cause unspecified: Secondary | ICD-10-CM | POA: Diagnosis not present

## 2019-09-28 LAB — CBC
HCT: 34.4 % — ABNORMAL LOW (ref 39.0–52.0)
Hemoglobin: 10.5 g/dL — ABNORMAL LOW (ref 13.0–17.0)
MCH: 28.4 pg (ref 26.0–34.0)
MCHC: 30.5 g/dL (ref 30.0–36.0)
MCV: 93 fL (ref 80.0–100.0)
Platelets: 355 10*3/uL (ref 150–400)
RBC: 3.7 MIL/uL — ABNORMAL LOW (ref 4.22–5.81)
RDW: 14 % (ref 11.5–15.5)
WBC: 14.1 10*3/uL — ABNORMAL HIGH (ref 4.0–10.5)
nRBC: 0.6 % — ABNORMAL HIGH (ref 0.0–0.2)

## 2019-09-28 LAB — POCT I-STAT 7, (LYTES, BLD GAS, ICA,H+H)
Acid-Base Excess: 4 mmol/L — ABNORMAL HIGH (ref 0.0–2.0)
Acid-Base Excess: 4 mmol/L — ABNORMAL HIGH (ref 0.0–2.0)
Acid-Base Excess: 4 mmol/L — ABNORMAL HIGH (ref 0.0–2.0)
Acid-Base Excess: 5 mmol/L — ABNORMAL HIGH (ref 0.0–2.0)
Acid-Base Excess: 5 mmol/L — ABNORMAL HIGH (ref 0.0–2.0)
Bicarbonate: 31.4 mmol/L — ABNORMAL HIGH (ref 20.0–28.0)
Bicarbonate: 34.7 mmol/L — ABNORMAL HIGH (ref 20.0–28.0)
Bicarbonate: 32.3 mmol/L — ABNORMAL HIGH (ref 20.0–28.0)
Bicarbonate: 33.8 mmol/L — ABNORMAL HIGH (ref 20.0–28.0)
Bicarbonate: 32.7 mmol/L — ABNORMAL HIGH (ref 20.0–28.0)
Calcium, Ion: 1.26 mmol/L (ref 1.15–1.40)
Calcium, Ion: 1.27 mmol/L (ref 1.15–1.40)
Calcium, Ion: 1.25 mmol/L (ref 1.15–1.40)
Calcium, Ion: 1.26 mmol/L (ref 1.15–1.40)
Calcium, Ion: 1.25 mmol/L (ref 1.15–1.40)
HCT: 31 % — ABNORMAL LOW (ref 39.0–52.0)
HCT: 32 % — ABNORMAL LOW (ref 39.0–52.0)
HCT: 30 % — ABNORMAL LOW (ref 39.0–52.0)
HCT: 31 % — ABNORMAL LOW (ref 39.0–52.0)
HCT: 30 % — ABNORMAL LOW (ref 39.0–52.0)
Hemoglobin: 10.5 g/dL — ABNORMAL LOW (ref 13.0–17.0)
Hemoglobin: 10.9 g/dL — ABNORMAL LOW (ref 13.0–17.0)
Hemoglobin: 10.2 g/dL — ABNORMAL LOW (ref 13.0–17.0)
Hemoglobin: 10.5 g/dL — ABNORMAL LOW (ref 13.0–17.0)
Hemoglobin: 10.2 g/dL — ABNORMAL LOW (ref 13.0–17.0)
O2 Saturation: 95 %
O2 Saturation: 99 %
O2 Saturation: 99 %
O2 Saturation: 100 %
O2 Saturation: 92 %
Patient temperature: 36.6
Patient temperature: 98.6
Patient temperature: 97.6
Patient temperature: 97.6
Potassium: 4.4 mmol/L (ref 3.5–5.1)
Potassium: 4.8 mmol/L (ref 3.5–5.1)
Potassium: 4.7 mmol/L (ref 3.5–5.1)
Potassium: 4.7 mmol/L (ref 3.5–5.1)
Potassium: 4.6 mmol/L (ref 3.5–5.1)
Sodium: 138 mmol/L (ref 135–145)
Sodium: 140 mmol/L (ref 135–145)
Sodium: 140 mmol/L (ref 135–145)
Sodium: 140 mmol/L (ref 135–145)
Sodium: 140 mmol/L (ref 135–145)
TCO2: 33 mmol/L — ABNORMAL HIGH (ref 22–32)
TCO2: 37 mmol/L — ABNORMAL HIGH (ref 22–32)
TCO2: 34 mmol/L — ABNORMAL HIGH (ref 22–32)
TCO2: 36 mmol/L — ABNORMAL HIGH (ref 22–32)
TCO2: 35 mmol/L — ABNORMAL HIGH (ref 22–32)
pCO2 arterial: 63.6 mmHg — ABNORMAL HIGH (ref 32.0–48.0)
pCO2 arterial: 91.5 mmHg (ref 32.0–48.0)
pCO2 arterial: 68.1 mmHg (ref 32.0–48.0)
pCO2 arterial: 79 mmHg (ref 32.0–48.0)
pCO2 arterial: 64.3 mmHg — ABNORMAL HIGH (ref 32.0–48.0)
pH, Arterial: 7.3 — ABNORMAL LOW (ref 7.350–7.450)
pH, Arterial: 7.187 — CL (ref 7.350–7.450)
pH, Arterial: 7.282 — ABNORMAL LOW (ref 7.350–7.450)
pH, Arterial: 7.239 — ABNORMAL LOW (ref 7.350–7.450)
pH, Arterial: 7.312 — ABNORMAL LOW (ref 7.350–7.450)
pO2, Arterial: 84 mmHg (ref 83.0–108.0)
pO2, Arterial: 202 mmHg — ABNORMAL HIGH (ref 83.0–108.0)
pO2, Arterial: 133 mmHg — ABNORMAL HIGH (ref 83.0–108.0)
pO2, Arterial: 222 mmHg — ABNORMAL HIGH (ref 83.0–108.0)
pO2, Arterial: 71 mmHg — ABNORMAL LOW (ref 83.0–108.0)

## 2019-09-28 LAB — GLUCOSE, CAPILLARY
Glucose-Capillary: 197 mg/dL — ABNORMAL HIGH (ref 70–99)
Glucose-Capillary: 208 mg/dL — ABNORMAL HIGH (ref 70–99)
Glucose-Capillary: 218 mg/dL — ABNORMAL HIGH (ref 70–99)
Glucose-Capillary: 189 mg/dL — ABNORMAL HIGH (ref 70–99)
Glucose-Capillary: 171 mg/dL — ABNORMAL HIGH (ref 70–99)
Glucose-Capillary: 155 mg/dL — ABNORMAL HIGH (ref 70–99)

## 2019-09-28 LAB — BASIC METABOLIC PANEL
Anion gap: 12 (ref 5–15)
BUN: 95 mg/dL — ABNORMAL HIGH (ref 8–23)
CO2: 28 mmol/L (ref 22–32)
Calcium: 8.8 mg/dL — ABNORMAL LOW (ref 8.9–10.3)
Chloride: 99 mmol/L (ref 98–111)
Creatinine, Ser: 2.47 mg/dL — ABNORMAL HIGH (ref 0.61–1.24)
GFR calc Af Amer: 30 mL/min — ABNORMAL LOW (ref >60)
GFR calc non Af Amer: 26 mL/min — ABNORMAL LOW (ref >60)
Glucose, Bld: 220 mg/dL — ABNORMAL HIGH (ref 70–99)
Potassium: 4.4 mmol/L (ref 3.5–5.1)
Sodium: 139 mmol/L (ref 135–145)

## 2019-09-28 LAB — MRSA PCR SCREENING: MRSA by PCR: NEGATIVE

## 2019-09-28 LAB — PHOSPHORUS: Phosphorus: 8.3 mg/dL — ABNORMAL HIGH (ref 2.5–4.6)

## 2019-09-28 LAB — SARS CORONAVIRUS 2 BY RT PCR (HOSPITAL ORDER, PERFORMED IN ~~LOC~~ HOSPITAL LAB): SARS Coronavirus 2: POSITIVE — AB

## 2019-09-28 LAB — TRIGLYCERIDES: Triglycerides: 207 mg/dL — ABNORMAL HIGH (ref <150)

## 2019-09-28 LAB — MAGNESIUM: Magnesium: 3.1 mg/dL — ABNORMAL HIGH (ref 1.7–2.4)

## 2019-09-28 MED ORDER — FREE WATER
200.0000 mL | Freq: Three times a day (TID) | Status: DC
  Administered 2019-09-30 – 2019-10-01 (×3): 200 mL

## 2019-09-28 MED ORDER — ARTIFICIAL TEARS OPHTHALMIC OINT
1.0000 "application " | TOPICAL_OINTMENT | Freq: Three times a day (TID) | OPHTHALMIC | Status: DC
  Administered 2019-09-28 – 2019-10-01 (×9): 1 via OPHTHALMIC
  Filled 2019-09-28 (×2): qty 3.5

## 2019-09-28 MED ORDER — METHYLPREDNISOLONE SODIUM SUCC 40 MG IJ SOLR
20.0000 mg | Freq: Every day | INTRAMUSCULAR | Status: DC
  Administered 2019-09-28 – 2019-10-01 (×4): 20 mg via INTRAVENOUS
  Filled 2019-09-28 (×4): qty 1

## 2019-09-28 MED ORDER — VITAL 1.5 CAL PO LIQD
1000.0000 mL | ORAL | Status: DC
  Filled 2019-09-28 (×4): qty 1000

## 2019-09-28 MED ORDER — ROCURONIUM BROMIDE 10 MG/ML (PF) SYRINGE
PREFILLED_SYRINGE | INTRAVENOUS | Status: AC
  Filled 2019-09-28: qty 10

## 2019-09-28 MED ORDER — PROSOURCE TF PO LIQD
45.0000 mL | Freq: Four times a day (QID) | ORAL | Status: DC
  Administered 2019-09-28 – 2019-10-01 (×4): 45 mL
  Filled 2019-09-28 (×4): qty 45

## 2019-09-28 MED ORDER — ROCURONIUM BROMIDE 50 MG/5ML IV SOLN
80.0000 mg | Freq: Once | INTRAVENOUS | Status: AC
  Administered 2019-09-28: 80 mg via INTRAVENOUS

## 2019-09-28 MED ORDER — SODIUM CHLORIDE 0.9 % IV SOLN
2.0000 g | Freq: Two times a day (BID) | INTRAVENOUS | Status: DC
  Administered 2019-09-28 – 2019-09-30 (×4): 2 g via INTRAVENOUS
  Filled 2019-09-28 (×5): qty 2

## 2019-09-28 MED ORDER — SODIUM CHLORIDE 0.9 % IV SOLN
0.0000 ug/kg/min | INTRAVENOUS | Status: DC
  Administered 2019-09-28 – 2019-09-30 (×3): 3 ug/kg/min via INTRAVENOUS
  Administered 2019-09-30: 2 ug/kg/min via INTRAVENOUS
  Filled 2019-09-28 (×6): qty 20

## 2019-09-28 NOTE — Progress Notes (Signed)
Pt's head turned to the right and arms rotated with no complications. ETT secured. Pt tolerated well.

## 2019-09-28 NOTE — Progress Notes (Signed)
At 1130, patient proned with three RNs, RRT, and Katrinka Blazing MD present. No events noted. Follow-up ABG just reported to [facility] MD. Awaiting new orders.

## 2019-09-28 NOTE — Progress Notes (Signed)
Inpatient Diabetes Program Recommendations  AACE/ADA: New Consensus Statement on Inpatient Glycemic Control (2015)  Target Ranges:  Prepandial:   less than 140 mg/dL      Peak postprandial:   less than 180 mg/dL (1-2 hours)      Critically ill patients:  140 - 180 mg/dL   Lab Results  Component Value Date   GLUCAP 218 (H) 09/28/2019   HGBA1C 6.7 (H) 10/16/2019    Review of Glycemic Control Results for KACI, FREEL (MRN [id number]) as of 09/28/2019 11:11  Ref. Range 09/27/2019 16:16 09/27/2019 20:14 09/28/2019 00:40 09/28/2019 05:07 09/28/2019 08:00  Glucose-Capillary Latest Ref Range: 70 - 99 mg/dL 657 (H) 846 (H) 962 (H) 208 (H) 218 (H)   Inpatient Diabetes Program Recommendations:   Please consider ICU Glycemic control order set.  Thank you, Billy Fischer. Daylani Deblois, RN, MSN, CDE  Diabetes Coordinator Inpatient Glycemic Control Team Team Pager [phone] (8am-5pm) 09/28/2019 11:11 AM

## 2019-09-28 NOTE — Progress Notes (Signed)
RT re-secured the ETT with cloth tape at 25 in the center after placing mepilex pads on both cheeks and above the patient's top lip.  Patient then placed in prone position by MD x 1, RT x 1, and RN x 3 without complications.  Patient does have copious oral secretions.

## 2019-09-28 NOTE — Progress Notes (Signed)
Nutrition Follow-up  DOCUMENTATION CODES:   Not applicable  INTERVENTION:   Reversed trendelenburg position of 10-25 degrees when in prone position  Tube Feeding via OG, continue TF at goal when in prone position:  Vital 1.5 at 55 ml/hr Pro-Source TF 45 mL QID Provides 2140 kcals, 133 g of protein and 1003 mL of free water Meets 100% protein needs  TF regimen and propofol at current rate providing 2568 total kcal/day (100 % of kcal needs)  Free water 200 mL q 8 hours: total of 1603 mL of free water with TF plus free water flush; follow fluid status, renal function. Likely need to increase free water   NUTRITION DIAGNOSIS:   Increased nutrient needs related to acute illness as evidenced by estimated needs.  Being addressed via TF   GOAL:   Patient will meet greater than or equal to 90% of their needs  Progressing  MONITOR:   Diet advancement, Vent status, Skin, TF tolerance, Weight trends, Labs, I & O's  REASON FOR ASSESSMENT:   Ventilator    ASSESSMENT:   Patient with PMH significant for DM, ETOH use, and recent COVID diagnosis on 7/21. On 8/10 pt was transferred to [facility] for continued care and on 8/13 was found to be in acute respiratory distress requiring intubation.  7/21-8/10 Admitted to OSH 8/10 Admitted to [facility] 8/13 Transfered to [facility] ICU,  Intubated  Prone protocol initiated, started on paralytic today Patient is currently intubated on ventilator support, currently in prone position; sedated on propofol, versed MV: 15.4 L/min Temp (24hrs), Avg:97.8 F (36.6 C), Min:97.6 F (36.4 C), Max:98.2 F (36.8 C)  Propofol: 16.2 ml/hr  Vital AF 1.2 at 50 ml/hr via OG, Pro-Source TF 90 mL TID OG tube with tip in stomach  Current weight 90.8 kg; admit weight 84.7 kg.   Unable to obtain diet and weight history at this time  AKI, Noted phosphorus elevated. Monitor trend  Labs: Creatinine 2.47, BUN 95, phosphorus 8.3 (H), CBGs  189-218 Meds: ss novolog, solumedrol, miralax   Diet Order:   Diet Order    None      EDUCATION NEEDS:   Not appropriate for education at this time  Skin:  Skin Assessment: Reviewed RN Assessment  Last BM:  8/16  Height:   Ht Readings from Last 1 Encounters:  09-27-2019 5\' 7"  (1.702 m)    Weight:   Wt Readings from Last 1 Encounters:  09/28/19 90.8 kg     BMI:  Body mass index is 31.35 kg/m.  Estimated Nutritional Needs:   Kcal:  2500-2700 kcals  Protein:  127-153 g  Fluid:  >/= 2 L/day   09/30/19 MS, RDN, LDN, CNSC Registered Dietitian III Clinical Nutrition RD Pager and On-Call Pager Number Located in [place]

## 2019-09-28 NOTE — Progress Notes (Signed)
Notified Smith MD of critical pCO2 on recent ABG. Orders received for repeat ABG at 1700.

## 2019-09-28 NOTE — Progress Notes (Addendum)
Pt's head turned to the left and arms rotated with no complications. ETT secured with at 25 at the lips with cloth tape. Pt tolerated well.

## 2019-09-28 NOTE — Progress Notes (Signed)
Notified Smith MD of critical pCO2 on recent ABG. Ventilator changes made. Orders received for repeat ABG at 2000.

## 2019-09-28 NOTE — Progress Notes (Addendum)
Pt's head turned without incident.  ET tube secure and clear.  RN @ bedside.

## 2019-09-28 NOTE — Progress Notes (Signed)
eLink Physician-Brief Progress Note Patient Name: Brian Guerra DOB: 05/27/1950 MRN: [id number]   Date of Service  09/28/2019  HPI/Events of Note  ABG on 80%/PCV 30/Rate 32/P 5 = 7.31/64.3/71  eICU Interventions  Plan: 1. Increase PEEP to 8.     Intervention Category Major Interventions: Respiratory failure - evaluation and management;Acid-Base disturbance - evaluation and management  [name] 09/28/2019, 9:58 PM

## 2019-09-28 NOTE — Progress Notes (Signed)
NAME:  Brian Guerra, MRN:  [id number], DOB:  [birth year], LOS: 3 ADMISSION DATE:  10/24/19, CONSULTATION DATE:  10/24/2019 REFERRING MD:  [facility], CHIEF COMPLAINT:  Acute Hypoxic Respiratory Failure    History of present illness   69 year old male, diagnosed with COVID on 7/21 at OSH. Completed 5 days of Remdesivir, 10 days of Decadron, and 5 days of Solu-medrol. Stay complicated by progressive hypoxia, Streptococcal PNA. Completed 5 day course of Azithromycin/Rocephin/Cefdinir. Enterococcus UTI treated with Ampicillin and then changed to amoxicillin. Acute Renal Failure with Rhabdomyolysis. And Transaminitis.   On 8/10 patient was transferred to [facility] for continued care. Patient remained on 40L High Flow Oxygen. On 8/13 patient was found to be in severe respiratory distress requiring intubation. Question aspiration event, patient with emesis before event. Transferred ICU.   Past Medical History  DM, ETOH/Tobacco Use (Quit 15 years ago)   Significant Hospital Events   7/21-8/10 > Admit to OSH  8/13 > Intubated and Transferred to ICU   Consults:  PCCM  Procedures:  ETT 8/13 >>  Significant Diagnostic Tests:  CXR 8/14> diffuse bilateral ASD   Micro Data:  Blood 8/13 >> Urine 8/13 >> Sputum 8/13 >> NF  Antimicrobials:  Cefepime 8/12>  Vanc 8/13>8/15 Interim history/subjective:  No events. Double stacking and derecruiting this AM requiring NMB.  Objective   Blood pressure (!) 150/61, pulse 100, temperature 97.8 F (36.6 C), temperature source Oral, resp. rate (!) 29, height 5\' 7"  (1.702 m), weight 90.8 kg, SpO2 96 %.    Vent Mode: PRVC FiO2 (%):  [90 %] 90 % Set Rate:  [35 bmp] 35 bmp Vt Set:  [420 mL] 420 mL PEEP:  [12 cmH20] 12 cmH20 Plateau Pressure:  [16 cmH20-18 cmH20] 16 cmH20   Intake/Output Summary (Last 24 hours) at 09/28/2019 09/30/2019 Last data filed at 09/28/2019 0800 Gross per 24 hour  Intake 1948.22 ml  Output 2100 ml  Net -151.78 ml    Filed Weights   09/26/19 0452 09/27/19 0500 09/28/19 0500  Weight: 85.8 kg 89.6 kg 90.8 kg   Physical Exam: General: Critically ill appearing, sedated HENT: [place], AT, ETT in place Eyes: EOMI, no scleral icterus Respiratory: Coarse breath sounds bilaterally Cardiovascular: RRR, ext warm GI: BS+, soft, nontender Extremities:minimal edema Neuro: paralyzed  CXR severe bilateral airspace disease Plat 35cmH2O DP 23 cmH2O  Resolved Hospital Problem list     Assessment & Plan:   ARDS secondary COVID pneumonia and Strep PNA (OSH Cx) He is 3.5 weeks post positive COVID test.  Not sure we need to keep him on airborne, will d/w infection prevention. S/p Ceftriaxone, Cedfinir, azithro x 5 days at OSH S/p remdesivir, decadron and solumedrol at OSH Worsened - Start proning protocol 16:8 - Limit driving pressures as able - Start NMB for today, propofol/fentanyl BIS 40-60 - Wean off IV steroids - Keep fluid even as able - Narrow abx to cefepime, 7 day duration reasonable  Acute Kidney Injury, suspected secondary to tubular necrosis  Plan - trend renal indices, I/O - keep even to dry, going to leave alone today and see where we are  DM Plan -Trend Glucose  -SSI    Best practice:  Diet: TF Pain/Anxiety/Delirium protocol (if indicated): propofol, fentanyl gtt  VAP protocol (if indicated):yes  DVT prophylaxis: Heparin sq  GI prophylaxis: PPI Glucose control: SSI Mobility: Bedrest  Code Status: Full  Family Communication: will update Disposition: ICU   The patient is critically ill with multiple organ systems  failure and requires high complexity decision making for assessment and support, frequent evaluation and titration of therapies, application of advanced monitoring technologies and extensive interpretation of multiple databases. Critical Care Time devoted to patient care services described in this note independent of APP/resident time (if applicable)  is 35 minutes.   Myrla Halsted MD [facility] Pulmonary Critical Care 09/28/2019 9:38 AM Personal pager: [phone] If unanswered, please page CCM On-call: #[phone]

## 2019-09-29 ENCOUNTER — Inpatient Hospital Stay (HOSPITAL_COMMUNITY): Payer: Medicare Other

## 2019-09-29 DIAGNOSIS — I469 Cardiac arrest, cause unspecified: Secondary | ICD-10-CM | POA: Diagnosis not present

## 2019-09-29 DIAGNOSIS — J80 Acute respiratory distress syndrome: Secondary | ICD-10-CM | POA: Diagnosis not present

## 2019-09-29 LAB — POCT I-STAT 7, (LYTES, BLD GAS, ICA,H+H)
Acid-Base Excess: 3 mmol/L — ABNORMAL HIGH (ref 0.0–2.0)
Acid-Base Excess: 5 mmol/L — ABNORMAL HIGH (ref 0.0–2.0)
Acid-Base Excess: 5 mmol/L — ABNORMAL HIGH (ref 0.0–2.0)
Bicarbonate: 29.7 mmol/L — ABNORMAL HIGH (ref 20.0–28.0)
Bicarbonate: 33.5 mmol/L — ABNORMAL HIGH (ref 20.0–28.0)
Bicarbonate: 34 mmol/L — ABNORMAL HIGH (ref 20.0–28.0)
Calcium, Ion: 1.23 mmol/L (ref 1.15–1.40)
Calcium, Ion: 1.27 mmol/L (ref 1.15–1.40)
Calcium, Ion: 1.28 mmol/L (ref 1.15–1.40)
HCT: 31 % — ABNORMAL LOW (ref 39.0–52.0)
HCT: 32 % — ABNORMAL LOW (ref 39.0–52.0)
HCT: 33 % — ABNORMAL LOW (ref 39.0–52.0)
Hemoglobin: 10.5 g/dL — ABNORMAL LOW (ref 13.0–17.0)
Hemoglobin: 10.9 g/dL — ABNORMAL LOW (ref 13.0–17.0)
Hemoglobin: 11.2 g/dL — ABNORMAL LOW (ref 13.0–17.0)
O2 Saturation: 92 %
O2 Saturation: 93 %
O2 Saturation: 94 %
Patient temperature: 36.5
Patient temperature: 98.9
Potassium: 4.3 mmol/L (ref 3.5–5.1)
Potassium: 4.3 mmol/L (ref 3.5–5.1)
Potassium: 5.1 mmol/L (ref 3.5–5.1)
Sodium: 142 mmol/L (ref 135–145)
Sodium: 142 mmol/L (ref 135–145)
Sodium: 143 mmol/L (ref 135–145)
TCO2: 31 mmol/L (ref 22–32)
TCO2: 36 mmol/L — ABNORMAL HIGH (ref 22–32)
TCO2: 36 mmol/L — ABNORMAL HIGH (ref 22–32)
pCO2 arterial: 57.5 mmHg — ABNORMAL HIGH (ref 32.0–48.0)
pCO2 arterial: 71.1 mmHg (ref 32.0–48.0)
pCO2 arterial: 71.6 mmHg (ref 32.0–48.0)
pH, Arterial: 7.278 — ABNORMAL LOW (ref 7.350–7.450)
pH, Arterial: 7.285 — ABNORMAL LOW (ref 7.350–7.450)
pH, Arterial: 7.322 — ABNORMAL LOW (ref 7.350–7.450)
pO2, Arterial: 75 mmHg — ABNORMAL LOW (ref 83.0–108.0)
pO2, Arterial: 77 mmHg — ABNORMAL LOW (ref 83.0–108.0)
pO2, Arterial: 77 mmHg — ABNORMAL LOW (ref 83.0–108.0)

## 2019-09-29 LAB — CBC WITH DIFFERENTIAL/PLATELET
Abs Immature Granulocytes: 0.2 10*3/uL — ABNORMAL HIGH (ref 0.00–0.07)
Basophils Absolute: 0 10*3/uL (ref 0.0–0.1)
Basophils Relative: 0 %
Eosinophils Absolute: 0 10*3/uL (ref 0.0–0.5)
Eosinophils Relative: 0 %
HCT: 35.9 % — ABNORMAL LOW (ref 39.0–52.0)
Hemoglobin: 10.9 g/dL — ABNORMAL LOW (ref 13.0–17.0)
Lymphocytes Relative: 3 %
Lymphs Abs: 0.4 10*3/uL — ABNORMAL LOW (ref 0.7–4.0)
MCH: 29.2 pg (ref 26.0–34.0)
MCHC: 30.4 g/dL (ref 30.0–36.0)
MCV: 96.2 fL (ref 80.0–100.0)
Monocytes Absolute: 0 10*3/uL — ABNORMAL LOW (ref 0.1–1.0)
Monocytes Relative: 0 %
Myelocytes: 2 %
Neutro Abs: 11.4 10*3/uL — ABNORMAL HIGH (ref 1.7–7.7)
Neutrophils Relative %: 95 %
Platelets: 345 10*3/uL (ref 150–400)
RBC: 3.73 MIL/uL — ABNORMAL LOW (ref 4.22–5.81)
RDW: 14.4 % (ref 11.5–15.5)
WBC: 12 10*3/uL — ABNORMAL HIGH (ref 4.0–10.5)
nRBC: 0.7 % — ABNORMAL HIGH (ref 0.0–0.2)
nRBC: 2 /100 WBC — ABNORMAL HIGH

## 2019-09-29 LAB — BASIC METABOLIC PANEL
Anion gap: 11 (ref 5–15)
BUN: 103 mg/dL — ABNORMAL HIGH (ref 8–23)
CO2: 29 mmol/L (ref 22–32)
Calcium: 9.1 mg/dL (ref 8.9–10.3)
Chloride: 103 mmol/L (ref 98–111)
Creatinine, Ser: 2.26 mg/dL — ABNORMAL HIGH (ref 0.61–1.24)
GFR calc Af Amer: 34 mL/min — ABNORMAL LOW (ref >60)
GFR calc non Af Amer: 29 mL/min — ABNORMAL LOW (ref >60)
Glucose, Bld: 112 mg/dL — ABNORMAL HIGH (ref 70–99)
Potassium: 4.3 mmol/L (ref 3.5–5.1)
Sodium: 143 mmol/L (ref 135–145)

## 2019-09-29 LAB — CBC
HCT: 37.6 % — ABNORMAL LOW (ref 39.0–52.0)
Hemoglobin: 11.2 g/dL — ABNORMAL LOW (ref 13.0–17.0)
MCH: 28.4 pg (ref 26.0–34.0)
MCHC: 29.8 g/dL — ABNORMAL LOW (ref 30.0–36.0)
MCV: 95.4 fL (ref 80.0–100.0)
Platelets: 392 10*3/uL (ref 150–400)
RBC: 3.94 MIL/uL — ABNORMAL LOW (ref 4.22–5.81)
RDW: 14.8 % (ref 11.5–15.5)
WBC: 23 10*3/uL — ABNORMAL HIGH (ref 4.0–10.5)
nRBC: 1.9 % — ABNORMAL HIGH (ref 0.0–0.2)

## 2019-09-29 LAB — TRIGLYCERIDES: Triglycerides: 249 mg/dL — ABNORMAL HIGH (ref <150)

## 2019-09-29 LAB — PROTIME-INR
INR: 1.3 — ABNORMAL HIGH (ref 0.8–1.2)
Prothrombin Time: 15.4 seconds — ABNORMAL HIGH (ref 11.4–15.2)

## 2019-09-29 LAB — GLUCOSE, CAPILLARY
Glucose-Capillary: 105 mg/dL — ABNORMAL HIGH (ref 70–99)
Glucose-Capillary: 133 mg/dL — ABNORMAL HIGH (ref 70–99)
Glucose-Capillary: 147 mg/dL — ABNORMAL HIGH (ref 70–99)
Glucose-Capillary: 153 mg/dL — ABNORMAL HIGH (ref 70–99)
Glucose-Capillary: 187 mg/dL — ABNORMAL HIGH (ref 70–99)
Glucose-Capillary: 188 mg/dL — ABNORMAL HIGH (ref 70–99)
Glucose-Capillary: 95 mg/dL (ref 70–99)

## 2019-09-29 LAB — APTT: aPTT: 24 seconds (ref 24–36)

## 2019-09-29 LAB — HEPARIN LEVEL (UNFRACTIONATED): Heparin Unfractionated: 0.53 IU/mL (ref 0.30–0.70)

## 2019-09-29 MED ORDER — ALTEPLASE (PULMONARY EMBOLISM) INFUSION
100.0000 mg | Freq: Once | INTRAVENOUS | Status: AC
  Administered 2019-09-29: 100 mg via INTRAVENOUS
  Filled 2019-09-29: qty 100

## 2019-09-29 MED ORDER — HEPARIN (PORCINE) 25000 UT/250ML-% IV SOLN
1000.0000 [IU]/h | INTRAVENOUS | Status: DC
  Administered 2019-09-30: 1100 [IU]/h via INTRAVENOUS
  Administered 2019-10-01: 1000 [IU]/h via INTRAVENOUS
  Filled 2019-09-29 (×2): qty 250

## 2019-09-29 MED ORDER — HYDROCORTISONE 20 MG PO TABS
50.0000 mg | ORAL_TABLET | Freq: Four times a day (QID) | ORAL | Status: DC
  Filled 2019-09-29 (×2): qty 1

## 2019-09-29 MED ORDER — SODIUM CHLORIDE 0.9 % IV SOLN: 250.0000 mL | Freq: Once | INTRAVENOUS | Status: DC

## 2019-09-29 MED ORDER — PANTOPRAZOLE SODIUM 40 MG IV SOLR
40.0000 mg | Freq: Two times a day (BID) | INTRAVENOUS | Status: DC
  Administered 2019-09-29 – 2019-10-01 (×5): 40 mg via INTRAVENOUS
  Filled 2019-09-29 (×5): qty 40

## 2019-09-29 MED ORDER — HEPARIN (PORCINE) 25000 UT/250ML-% IV SOLN
1400.0000 [IU]/h | INTRAVENOUS | Status: DC
  Administered 2019-09-29: 1400 [IU]/h via INTRAVENOUS
  Filled 2019-09-29: qty 250

## 2019-09-29 MED ORDER — HYDROCORTISONE NA SUCCINATE PF 100 MG IJ SOLR
50.0000 mg | Freq: Four times a day (QID) | INTRAMUSCULAR | Status: DC
  Administered 2019-09-29 – 2019-10-01 (×8): 50 mg via INTRAVENOUS
  Filled 2019-09-29 (×8): qty 2

## 2019-09-29 MED ORDER — VASOPRESSIN 20 UNITS/100 ML INFUSION FOR SHOCK
0.0000 [IU]/min | INTRAVENOUS | Status: DC
  Administered 2019-09-29: 0.03 [IU]/min via INTRAVENOUS
  Administered 2019-09-29: 0.04 [IU]/min via INTRAVENOUS
  Administered 2019-09-30 – 2019-10-01 (×3): 0.03 [IU]/min via INTRAVENOUS
  Filled 2019-09-29 (×5): qty 100

## 2019-09-29 NOTE — Progress Notes (Signed)
ANTICOAGULATION CONSULT NOTE  Pharmacy Consult for heparin Indication: VTE treatment  No Known Allergies  Patient Measurements: Height: _0  (170.2 cm) Weight: 94.3 kg (207 lb 14.3 oz) IBW/kg (Calculated) : 66.1 Heparin Dosing Weight: 86 kg  Vital Signs: Temp: 100.6 F (38.1 C) (08/17 1207) Temp Source: Oral (08/17 1207) BP: 88/46 (08/17 1107) Pulse Rate: 125 (08/17 1400)  Labs: Recent Labs    09/27/19 0412 09/27/19 1258 09/28/19 0506 09/28/19 0510 09/29/19 0442 09/29/19 0442 09/29/19 0453 09/29/19 1430  HGB 10.3*   < > 10.5*   < > 10.9*   < > 10.9* 11.2*  HCT 33.3*   < > 34.4*   < > 35.9*  --  32.0* 37.6*  PLT 350  --  355  --  345  --   --  392  APTT  --   --   --   --   --   --   --  24  LABPROT  --   --   --   --   --   --   --  15.4*  INR  --   --   --   --   --   --   --  1.3*  CREATININE 2.60*  --  2.47*  --  2.26*  --   --   --    < > = values in this interval not displayed.    Estimated Creatinine Clearance: 34.7 mL/min (A) (by C-G formula based on SCr of 2.26 mg/dL (H)).   Medical History: Past Medical History:  Diagnosis Date  . Acute on chronic respiratory failure with hypoxia ([place])   . Acute renal failure due to tubular necrosis ([place])   . COVID-19 virus infection   . Pneumonia due to COVID-19 virus   . Severe sepsis (H[facility])     Medications:  Scheduled:  . artificial tears  1 application Both Eyes M5H  . chlorhexidine gluconate (MEDLINE KIT)  15 mL Mouth Rinse BID  . Chlorhexidine Gluconate Cloth  6 each Topical Daily  . docusate  100 mg Oral BID  . feeding supplement (PROSource TF)  45 mL Per Tube QID  . free water  200 mL Per Tube Q8H  . hydrocortisone sod succinate (SOLU-CORTEF) inj  50 mg Intravenous Q6H  . insulin aspart  0-15 Units Subcutaneous Q4H  . mouth rinse  15 mL Mouth Rinse 10 times per day  . methylPREDNISolone (SOLU-MEDROL) injection  20 mg Intravenous Daily  . pantoprazole (PROTONIX) IV  40 mg Intravenous Q12H  .  polyethylene glycol  17 g Oral Daily  . sodium chloride flush  10-40 mL Intracatheter Q12H    Assessment: Brian Guerra is a 69 YO M admitted for ARDS secondary to COVID pneumonia.  Possible VTE given poor right heart function.  Pharmacy consulted to dose heparin. tPA given emergently with concern for PE and unstable hemodynamics, aPTT 30 min post-administration wnl. CBC remains stable.   Goal of Therapy:  Heparin level 0.3-0.5 units/ml Monitor platelets by anticoagulation protocol: Yes   Plan:  -Begin heparin 1200 units/h with no bolus -Check 6hr heparin level   Arrie Senate, PharmD, BCPS Clinical Pharmacist [phone] Please check AMION for all [place] numbers 09/29/2019

## 2019-09-29 NOTE — Progress Notes (Addendum)
NAME:  Brian Guerra, MRN:  [id number], DOB:  21-Mar-1950, LOS: 4 ADMISSION DATE:  Oct 01, 2019, CONSULTATION DATE:  Oct 01, 2019 REFERRING MD:  [facility], CHIEF COMPLAINT:  Acute Hypoxic Respiratory Failure    History of present illness   69 year old male, diagnosed with COVID on 7/21 at OSH. Completed 5 days of Remdesivir, 10 days of Decadron, and 5 days of Solu-medrol. Stay complicated by progressive hypoxia, Streptococcal PNA. Completed 5 day course of Azithromycin/Rocephin/Cefdinir. Enterococcus UTI treated with Ampicillin and then changed to amoxicillin. Acute Renal Failure with Rhabdomyolysis. And Transaminitis.   On 8/10 patient was transferred to [facility] for continued care. Patient remained on 40L High Flow Oxygen. On 8/13 patient was found to be in severe respiratory distress requiring intubation. Question aspiration event, patient with emesis before event. Transferred ICU.   Past Medical History  DM, ETOH/Tobacco Use (Quit 15 years ago)   Significant Hospital Events   7/21-8/10 > Admit to OSH  8/13 > Intubated and Transferred to ICU   Consults:  PCCM  Procedures:  ETT 8/13 >>  Significant Diagnostic Tests:  CXR 8/14> diffuse bilateral ASD   Micro Data:  Blood 8/13 >> Urine 8/13 >> Sputum 8/13 >> NF  Antimicrobials:  Cefepime 8/16>  Vanc 8/13>8/15 Interim history/subjective:   Prone ON with no changes in ABGs, maxed out on rate  Objective   Blood pressure (!) 127/58, pulse (!) 124, temperature 99.6 F (37.6 C), resp. rate (!) 35, height 5\' 7"  (1.702 m), weight 94.3 kg, SpO2 90 %.    Vent Mode: PCV FiO2 (%):  [80 %-100 %] 80 % Set Rate:  [32 bmp-35 bmp] 35 bmp Vt Set:  [420 mL] 420 mL PEEP:  [5 cmH20-12 cmH20] 8 cmH20 Plateau Pressure:  [34 cmH20-36 cmH20] 35 cmH20   Intake/Output Summary (Last 24 hours) at 09/29/2019 0842 Last data filed at 09/29/2019 0700 Gross per 24 hour  Intake 1390.16 ml  Output 1685 ml  Net -294.84 ml   Filed Weights    09/27/19 0500 09/28/19 0500 09/29/19 0500  Weight: 89.6 kg 90.8 kg 94.3 kg   Physical Exam: Physical Exam Constitutional:      Comments: Sedated, intubated, paralyzed.  HENT:     Head: Normocephalic.  Cardiovascular:     Rate and Rhythm: Normal rate and regular rhythm.     Pulses: Normal pulses.     Heart sounds: No murmur heard.  No friction rub. No gallop.   Pulmonary:     Comments: Coarse breath sounds auscultated bilaterally.  Abdominal:     General: Bowel sounds are normal.     Tenderness: There is no abdominal tenderness. There is no guarding.       Resolved Hospital Problem list     Assessment & Plan:   ARDS secondary COVID pneumonia and Strep PNA (OSH Cx) He is 3.5 weeks post positive COVID test.  Not sure we need to keep him on airborne, will d/w infection prevention. Leukocytosis improving to 12 from yesterday, no fever events ON, with stable vitals ON.  S/p Ceftriaxone, Cedfinir, azithro x 5 days at OSH S/p remdesivir, decadron and solumedrol at OSH Worsened - Continue proning protocol 16:8 while P/F < 150 - Limit driving pressures as able - Continue NMB for today, propofol/fentanyl/versed BIS 40-60, will have to sacrifice some PEEP for ventilation given degree of dead space (pH 7.2 with MV 17LPM) - Wean off IV steroids as ordered - Keep fluid even as able, net even - Continue cefepime, end  date 8/22 - Guarded prognosis, will see if family can come in to discuss what to do if we take a turn for worse  Acute Kidney Injury, suspected secondary to tubular necrosis  Improvement in Cr to 2.26 today from yesterday's 2.47 with I/O of 1.5L/1.8L with 1.3L UOP Plan - trend renal indices, I/O - keep even to dry, going to leave alone today and see where we are  DM Plan -Trend Glucose  -SSI   High TF residuals, N/V- check KUB, consider reglan, hold on TF for now  Acute right heart strain in setting of ARDS, likely aspiration- negative LE duplex, low risk/reward  ratio for starting full dose AC, will go ahead with this  Best practice:  Diet: on hold Pain/Anxiety/Delirium protocol (if indicated): as above VAP protocol (if indicated):yes  DVT prophylaxis: Heparin sq  GI prophylaxis: PPI Glucose control: SSI Mobility: Bedrest  Code Status: Full  Family Communication: will update Disposition: ICU   Dolan Amen, MD IMTS, PGY-1 Pager: [phone] 09/29/2019,9:19 AM  Patient seen and examined independently and with resident.  Agree with their assessment and plan as written.  Unfortunately not making much progress despite maximal support, add full dose AC given McConnell's sign on echo although this was likely just aspiration>>severe ARDS>>RV strain.  Will have wife come in and discuss next steps.  The patient is critically ill with multiple organ systems failure and requires high complexity decision making for assessment and support, frequent evaluation and titration of therapies, application of advanced monitoring technologies and extensive interpretation of multiple databases. Critical Care Time devoted to patient care services described in this note independent of APP/resident time (if applicable)  is 35 minutes.   Myrla Halsted MD [place] [facility] 09/29/2019 9:35 AM Personal pager: [phone] If unanswered, please page CCM On-call: [phone]

## 2019-09-29 NOTE — Progress Notes (Signed)
eLink Physician-Brief Progress Note Patient Name: Brian Guerra DOB: 14-Apr-1950 MRN: [id number]   Date of Service  09/29/2019  HPI/Events of Note  ABG on PC 30/Rate 35/P 8 = 7.28/71.1/77. ABG is really not changed from earlier. No room to increase rate. May need to go back on PRVC.  eICU Interventions  Continue present ventilator management. Further management per rounding team.       Intervention Category Major Interventions: Respiratory failure - evaluation and management;Acid-Base disturbance - evaluation and management  Larissa Pegg [place] 09/29/2019, 6:04 AM

## 2019-09-29 NOTE — Progress Notes (Signed)
eLink Physician-Brief Progress Note Patient Name: Brian Guerra DOB: [birth year] MRN: [id number]   Date of Service  09/29/2019  HPI/Events of Note  ABg on 80%/PCV 30/Rate = 32/P 8 = 7.278/71.6/75.   eICU Interventions  Plan: 1. Increase Rate to 35.  2. Repeat ABG at 5 AM.     Intervention Category Major Interventions: Respiratory failure - evaluation and management;Acid-Base disturbance - evaluation and management  Kelven Flater [place] 09/29/2019, 2:24 AM

## 2019-09-29 NOTE — Progress Notes (Addendum)
Progressively worse tachycardia, hypotension on high dose pressors.  RV blown out on bedside echo.  Too unstable for CTA.  CXR unchanged. Only real thing left to try is thrombolytics.  Calling family in.  Additional 55 min cc time.  Myrla Halsted MD PCCM

## 2019-09-29 NOTE — Progress Notes (Signed)
ANTICOAGULATION CONSULT NOTE  Pharmacy Consult for heparin Indication: VTE treatment  No Known Allergies  Patient Measurements: Height: 5\' 7"  (170.2 cm) Weight: 94.3 kg (207 lb 14.3 oz) IBW/kg (Calculated) : 66.1 Heparin Dosing Weight: 86 kg  Vital Signs: Temp: 98.8 F (37.1 C) (08/17 1606) Temp Source: Axillary (08/17 1606) BP: 106/62 (08/17 1553) Pulse Rate: 92 (08/17 2300)  Labs: Recent Labs    09/27/19 0412 09/27/19 1258 09/28/19 0506 09/28/19 0510 09/29/19 0442 09/29/19 0442 09/29/19 0453 09/29/19 0453 09/29/19 1430 09/29/19 1541 09/29/19 2217  HGB 10.3*   < > 10.5*   < > 10.9*   < > 10.9*   < > 11.2* 11.2*  --   HCT 33.3*   < > 34.4*   < > 35.9*   < > 32.0*  --  37.6* 33.0*  --   PLT 350  --  355  --  345  --   --   --  392  --   --   APTT  --   --   --   --   --   --   --   --  24  --   --   LABPROT  --   --   --   --   --   --   --   --  15.4*  --   --   INR  --   --   --   --   --   --   --   --  1.3*  --   --   HEPARINUNFRC  --   --   --   --   --   --   --   --   --   --  0.53  CREATININE 2.60*  --  2.47*  --  2.26*  --   --   --   --   --   --    < > = values in this interval not displayed.    Estimated Creatinine Clearance: 34.7 mL/min (A) (by C-G formula based on SCr of 2.26 mg/dL (H)).   Assessment: 69 yo male with possible VTE s/p alteplase for heparin   Goal of Therapy:  Heparin level 0.3-0.5 units/ml Monitor platelets by anticoagulation protocol: Yes   Plan:  Decrease Heparin 1100 units/hr Follow-up am labs.  79, PharmD, BCPS  09/29/2019

## 2019-09-29 NOTE — Progress Notes (Signed)
Pt supine at this time by RTx1 and RNx6 without any complications. ETT secured. Pt tolerated well.

## 2019-09-29 NOTE — Progress Notes (Signed)
Removed cloth tape to evaluate skin for breakdown from proning.  No redness or blistering noted on cheeks, lips or upper lip.Inside of lips ok with no pressure ulcers noted.  Resecured ET tube with Hollister tube holder and relocated tube to right side of the mouth.

## 2019-09-29 NOTE — Progress Notes (Addendum)
ANTICOAGULATION CONSULT NOTE - Initial Consult  Pharmacy Consult for heparin Indication: VTE treatment  No Known Allergies  Patient Measurements: Height: '5\' 7"'  (170.2 cm) Weight: 94.3 kg (207 lb 14.3 oz) IBW/kg (Calculated) : 66.1 Heparin Dosing Weight: 86 kg  Vital Signs: Temp: 99.6 F (37.6 C) (08/17 0818) Temp Source: Oral (08/17 0310) BP: 127/58 (08/17 0818) Pulse Rate: 124 (08/17 0818)  Labs: Recent Labs    09/27/19 0412 09/27/19 1258 09/28/19 0506 09/28/19 0510 09/29/19 0038 09/29/19 0038 09/29/19 0442 09/29/19 0453  HGB 10.3*   < > 10.5*   < > 10.5*   < > 10.9* 10.9*  HCT 33.3*   < > 34.4*   < > 31.0*  --  35.9* 32.0*  PLT 350  --  355  --   --   --  345  --   CREATININE 2.60*  --  2.47*  --   --   --  2.26*  --    < > = values in this interval not displayed.    Estimated Creatinine Clearance: 34.7 mL/min (A) (by C-G formula based on SCr of 2.26 mg/dL (H)).   Medical History: Past Medical History:  Diagnosis Date  . Acute on chronic respiratory failure with hypoxia ([place])   . Acute renal failure due to tubular necrosis ([place])   . COVID-19 virus infection   . Pneumonia due to COVID-19 virus   . Severe sepsis (H[facility])     Medications:  Scheduled:  . artificial tears  1 application Both Eyes Y1O  . chlorhexidine gluconate (MEDLINE KIT)  15 mL Mouth Rinse BID  . Chlorhexidine Gluconate Cloth  6 each Topical Daily  . docusate  100 mg Oral BID  . feeding supplement (PROSource TF)  45 mL Per Tube QID  . free water  200 mL Per Tube Q8H  . insulin aspart  0-15 Units Subcutaneous Q4H  . mouth rinse  15 mL Mouth Rinse 10 times per day  . methylPREDNISolone (SOLU-MEDROL) injection  20 mg Intravenous Daily  . pantoprazole (PROTONIX) IV  40 mg Intravenous Q12H  . polyethylene glycol  17 g Oral Daily  . sodium chloride flush  10-40 mL Intracatheter Q12H    Assessment: Brian Guerra is a 69 YO M admitted for ARDS secondary to COVID pneumonia.  Possible VTE given  poor right heart function.  Pharmacy consulted to dose heparin. CBC stable   Goal of Therapy:  Heparin level 0.3-0.7 units/ml Monitor platelets by anticoagulation protocol: Yes   Plan:  Start heparin infusion at 1400 units/hr (no bolus) Monitor 6 hour HL Monitor daily CBC/signs of bleeding  Dimple Nanas, PharmD PGY-1 Acute Care Pharmacy Resident 09/29/2019 9:54 AM

## 2019-09-29 NOTE — Progress Notes (Signed)
Markedly improved hemodynamics and saturations after tpa, iNO. Going to hold off proning tonight. Still awaiting family arrival.

## 2019-09-29 NOTE — Progress Notes (Signed)
ABG drawn, critical results given to E-link RN.

## 2019-09-29 NOTE — Progress Notes (Signed)
BP low maxed on levo.  Pt HR 140's-150's.  MD aware. EKG confirmed ST. Dr. Katrinka Blazing gave verbal to stop propofol and stop Nimbex. Tirtate levo to maintain BP and MD coming to bedside.

## 2019-09-29 NOTE — Progress Notes (Addendum)
eLink Physician-Brief Progress Note Patient Name: Brian Guerra DOB: [birth year] MRN: [id number]   Date of Service  09/29/2019  HPI/Events of Note  Nursing request for AM labs.   eICU Interventions  Will order CBC with platelets and BMP at 5 AM.     Intervention Category Major Interventions: Other:  [name] 09/29/2019, 4:32 AM

## 2019-09-29 NOTE — Progress Notes (Signed)
Met with patient's children and wife regarding maximal vent support and marginal clinical status, worsening over last day.  We agreed to continue the current level of care but if he takes a turn allow him to pass in peace.  Family would like to be notified ASAP if he worsens further.  DNR entered  Erskine Emery MD PCCM

## 2019-09-29 NOTE — Progress Notes (Signed)
Pt's head turned to the left and arms rotated with no complications.  ETT secured

## 2019-09-30 ENCOUNTER — Inpatient Hospital Stay (HOSPITAL_COMMUNITY): Payer: Medicare Other

## 2019-09-30 DIAGNOSIS — U071 COVID-19: Secondary | ICD-10-CM | POA: Diagnosis not present

## 2019-09-30 LAB — POCT I-STAT 7, (LYTES, BLD GAS, ICA,H+H)
Acid-Base Excess: 1 mmol/L (ref 0.0–2.0)
Acid-Base Excess: 2 mmol/L (ref 0.0–2.0)
Acid-Base Excess: 1 mmol/L (ref 0.0–2.0)
Bicarbonate: 29.8 mmol/L — ABNORMAL HIGH (ref 20.0–28.0)
Bicarbonate: 30.1 mmol/L — ABNORMAL HIGH (ref 20.0–28.0)
Bicarbonate: 28.7 mmol/L — ABNORMAL HIGH (ref 20.0–28.0)
Calcium, Ion: 1.24 mmol/L (ref 1.15–1.40)
Calcium, Ion: 1.24 mmol/L (ref 1.15–1.40)
Calcium, Ion: 1.24 mmol/L (ref 1.15–1.40)
HCT: 31 % — ABNORMAL LOW (ref 39.0–52.0)
HCT: 29 % — ABNORMAL LOW (ref 39.0–52.0)
HCT: 31 % — ABNORMAL LOW (ref 39.0–52.0)
Hemoglobin: 10.5 g/dL — ABNORMAL LOW (ref 13.0–17.0)
Hemoglobin: 9.9 g/dL — ABNORMAL LOW (ref 13.0–17.0)
Hemoglobin: 10.5 g/dL — ABNORMAL LOW (ref 13.0–17.0)
O2 Saturation: 94 %
O2 Saturation: 97 %
O2 Saturation: 94 %
Patient temperature: 98.3
Patient temperature: 98
Potassium: 4.9 mmol/L (ref 3.5–5.1)
Potassium: 5 mmol/L (ref 3.5–5.1)
Potassium: 5.3 mmol/L — ABNORMAL HIGH (ref 3.5–5.1)
Sodium: 143 mmol/L (ref 135–145)
Sodium: 144 mmol/L (ref 135–145)
Sodium: 144 mmol/L (ref 135–145)
TCO2: 32 mmol/L (ref 22–32)
TCO2: 32 mmol/L (ref 22–32)
TCO2: 31 mmol/L (ref 22–32)
pCO2 arterial: 72.3 mmHg (ref 32.0–48.0)
pCO2 arterial: 66.1 mmHg (ref 32.0–48.0)
pCO2 arterial: 62.9 mmHg — ABNORMAL HIGH (ref 32.0–48.0)
pH, Arterial: 7.223 — ABNORMAL LOW (ref 7.350–7.450)
pH, Arterial: 7.265 — ABNORMAL LOW (ref 7.350–7.450)
pH, Arterial: 7.268 — ABNORMAL LOW (ref 7.350–7.450)
pO2, Arterial: 90 mmHg (ref 83.0–108.0)
pO2, Arterial: 101 mmHg (ref 83.0–108.0)
pO2, Arterial: 81 mmHg — ABNORMAL LOW (ref 83.0–108.0)

## 2019-09-30 LAB — CBC
HCT: 33 % — ABNORMAL LOW (ref 39.0–52.0)
Hemoglobin: 10.1 g/dL — ABNORMAL LOW (ref 13.0–17.0)
MCH: 28.5 pg (ref 26.0–34.0)
MCHC: 30.6 g/dL (ref 30.0–36.0)
MCV: 93.2 fL (ref 80.0–100.0)
Platelets: 335 10*3/uL (ref 150–400)
RBC: 3.54 MIL/uL — ABNORMAL LOW (ref 4.22–5.81)
RDW: 14.7 % (ref 11.5–15.5)
WBC: 19.7 10*3/uL — ABNORMAL HIGH (ref 4.0–10.5)
nRBC: 1.1 % — ABNORMAL HIGH (ref 0.0–0.2)

## 2019-09-30 LAB — BASIC METABOLIC PANEL
Anion gap: 10 (ref 5–15)
BUN: 134 mg/dL — ABNORMAL HIGH (ref 8–23)
CO2: 26 mmol/L (ref 22–32)
Calcium: 8.3 mg/dL — ABNORMAL LOW (ref 8.9–10.3)
Chloride: 107 mmol/L (ref 98–111)
Creatinine, Ser: 3.27 mg/dL — ABNORMAL HIGH (ref 0.61–1.24)
GFR calc Af Amer: 21 mL/min — ABNORMAL LOW (ref >60)
GFR calc non Af Amer: 19 mL/min — ABNORMAL LOW (ref >60)
Glucose, Bld: 186 mg/dL — ABNORMAL HIGH (ref 70–99)
Potassium: 5 mmol/L (ref 3.5–5.1)
Sodium: 143 mmol/L (ref 135–145)

## 2019-09-30 LAB — CULTURE, BLOOD (ROUTINE X 2)
Culture: NO GROWTH
Culture: NO GROWTH
Special Requests: ADEQUATE
Special Requests: ADEQUATE

## 2019-09-30 LAB — MAGNESIUM: Magnesium: 3.4 mg/dL — ABNORMAL HIGH (ref 1.7–2.4)

## 2019-09-30 LAB — GLUCOSE, CAPILLARY
Glucose-Capillary: 183 mg/dL — ABNORMAL HIGH (ref 70–99)
Glucose-Capillary: 149 mg/dL — ABNORMAL HIGH (ref 70–99)
Glucose-Capillary: 161 mg/dL — ABNORMAL HIGH (ref 70–99)
Glucose-Capillary: 192 mg/dL — ABNORMAL HIGH (ref 70–99)
Glucose-Capillary: 209 mg/dL — ABNORMAL HIGH (ref 70–99)

## 2019-09-30 LAB — PHOSPHORUS: Phosphorus: 5.4 mg/dL — ABNORMAL HIGH (ref 2.5–4.6)

## 2019-09-30 LAB — TRIGLYCERIDES: Triglycerides: 125 mg/dL (ref <150)

## 2019-09-30 LAB — HEPARIN LEVEL (UNFRACTIONATED): Heparin Unfractionated: 0.59 IU/mL (ref 0.30–0.70)

## 2019-09-30 MED ORDER — SODIUM CHLORIDE 0.9 % IV SOLN
2.0000 g | INTRAVENOUS | Status: DC
  Administered 2019-10-01: 2 g via INTRAVENOUS
  Filled 2019-09-30 (×2): qty 2

## 2019-09-30 MED ORDER — ALBUTEROL SULFATE (2.5 MG/3ML) 0.083% IN NEBU
2.5000 mg | INHALATION_SOLUTION | Freq: Four times a day (QID) | RESPIRATORY_TRACT | Status: DC
  Administered 2019-09-30 – 2019-10-01 (×4): 2.5 mg via RESPIRATORY_TRACT
  Filled 2019-09-30 (×4): qty 3

## 2019-09-30 MED ORDER — VITAL 1.5 CAL PO LIQD
1000.0000 mL | ORAL | Status: DC
  Administered 2019-09-30: 1000 mL
  Filled 2019-09-30 (×2): qty 1000

## 2019-09-30 MED ORDER — ALBUTEROL SULFATE (2.5 MG/3ML) 0.083% IN NEBU: 2.5000 mg | INHALATION_SOLUTION | Freq: Four times a day (QID) | RESPIRATORY_TRACT | Status: DC | PRN

## 2019-09-30 NOTE — Progress Notes (Addendum)
NAME:  Brian Guerra, MRN:  [id number], DOB:  04/16/1950, LOS: 5 ADMISSION DATE:  09/27/2019, CONSULTATION DATE:  10/02/2019 REFERRING MD:  [facility], CHIEF COMPLAINT:  Acute Hypoxic Respiratory Failure    History of present illness   69 year old male, diagnosed with COVID on 7/21 at OSH. Completed 5 days of Remdesivir, 10 days of Decadron, and 5 days of Solu-medrol. Stay complicated by progressive hypoxia, Streptococcal PNA. Completed 5 day course of Azithromycin/Rocephin/Cefdinir. Enterococcus UTI treated with Ampicillin and then changed to amoxicillin. Acute Renal Failure with Rhabdomyolysis. And Transaminitis.   On 8/10 patient was transferred to [facility] for continued care. Patient remained on 40L High Flow Oxygen. On 8/13 patient was found to be in severe respiratory distress requiring intubation. Question aspiration event, patient with emesis before event. Transferred ICU.   Past Medical History  DM, ETOH/Tobacco Use (Quit 15 years ago)   Significant Hospital Events   7/21-8/10 > Admit to OSH  8/13 > Intubated and Transferred to ICU   Consults:  PCCM  Procedures:  ETT 8/13 >>  Significant Diagnostic Tests:  CXR 8/14> diffuse bilateral ASD   Micro Data:  Blood 8/13 >> Urine 8/13 >> Sputum 8/13 >> NF  Antimicrobials:  Cefepime 8/16>  Vanc 8/13>8/15 Interim history/subjective:   Continues to be hypercarbic, saturating well and HR resolved after tpa yesterday. Will bronchodilate and check afternoon gas for improvement.   Objective   Blood pressure 106/62, pulse 94, temperature 98.1 F (36.7 C), resp. rate (!) 30, height 5\' 7"  (1.702 m), weight 95.5 kg, SpO2 100 %.    Vent Mode: PCV FiO2 (%):  [80 %] 80 % Set Rate:  [35 bmp] 35 bmp PEEP:  [8 cmH20] 8 cmH20 Pressure Support:  [8 cmH20] 8 cmH20 Plateau Pressure:  [32 cmH20-36 cmH20] 36 cmH20   Intake/Output Summary (Last 24 hours) at 09/30/2019 0826 Last data filed at 09/30/2019 0800 Gross per 24 hour   Intake 1892.63 ml  Output 1045 ml  Net 847.63 ml   Filed Weights   09/28/19 0500 09/29/19 0500 09/30/19 0413  Weight: 90.8 kg 94.3 kg 95.5 kg   Physical Exam: Physical Exam Constitutional:      Appearance: He is ill-appearing.     Comments: Sedated, intubated   Cardiovascular:     Rate and Rhythm: Normal rate and regular rhythm.     Pulses: Normal pulses.     Heart sounds: Normal heart sounds. No murmur heard.  No friction rub. No gallop.   Pulmonary:     Comments: Coarse breath sounds bilaterally Abdominal:     General: Abdomen is flat. Bowel sounds are normal.     Tenderness: There is no abdominal tenderness. There is no guarding.      Resolved Hospital Problem list     Assessment & Plan:   ARDS secondary COVID pneumonia and Strep PNA (OSH Cx) He is 3.5 weeks post positive COVID test. Leukocytosis increased to 19 ON, likely 2/2 to stress from yesterday's hypotensive event and a Tmax at around the time patient was severely tachycardic and hypotensive. , no fever events ON. Markedly improved with addition of tpa, CORTEF, nitric oxide yesterday with concerns for PE after patient rapidly decompensated. Proning held overnight. S/p Ceftriaxone, Cedfinir, azithro x 5 days at OSH S/p remdesivir, decadron and solumedrol at OSH - Continues to be hypercarbic on ABGs, will restart Nimbex and give bronchodilators. Check ABG later this afternoon.  - Wean off IV steroids as ordered - Keep fluid even  as able, net even - Continue cefepime, stop date 8/22 - Guarded prognosis, spoke with family yesterday, if patient continues to clinically worsen, family would not wish to continue with CPR, Code status changed to DNR.   Acute Kidney Injury, suspected secondary to tubular necrosis  Cr of 3.27 today, 845 mL of UOP yesterday, net positive 787 mL yesterday/ON.  Plan - trend BMP, I/O - keep even to dry, going to leave alone today and see where we are  DM Plan -Trend Glucose  -SSI    High TF residuals, N/V - Cortrak post pyloric  Acute right heart strain in setting of ARDS, likely aspiration Started Full dose AC heparin yesterday, additional tpa given after acutely decompensated with bedside US R heart strain, tachy in the 150s and desaturating into the low 80s.  - Continue Heparin.   Best practice:  Diet: on hold Pain/Anxiety/Delirium protocol (if indicated): as above VAP protocol (if indicated):yes  DVT prophylaxis: Heparin sq  GI prophylaxis: PPI Glucose control: SSI Mobility: Bedrest  Code Status: Full  Family Communication: will update Disposition: ICU   Dolan Amen, MD IMTS, PGY-2 09/30/2019,1:37 PM

## 2019-09-30 NOTE — Progress Notes (Signed)
RT NOTE: ABG results shown to Dr. Drucie Ip

## 2019-09-30 NOTE — Progress Notes (Signed)
Pharmacy Antibiotic Note  Brian Guerra is a 69 y.o. male admitted on 10/11/2019 with fevers/possible PNA.  Pharmacy has been consulted for cefepime dosing.  Patient was diagnosed with COVID on 7/21 at OSH.  S/p remdesivir x 5 days, Decadron x 10 days, and Solu-Medrol x 5 days. On 8/10 patient was transferred to [facility] for continued care on 40L high flow oxygen.  Recent txt for enterococcus UTI and strep PNA.  Patient found to be in severe respiratory distress 8/13 requiring intubation.    Cefepime day 3 of 7. WBC trending down, Scr up to 3.27, CrCl 58>24 ml/min.  Plan: Adjust Cefepime to 2g IV q24 hrs for worsening renal function Monitor renal function, cultures/sensitivities, clinical progression  Height: 5\' 7"  (170.2 cm) Weight: 95.5 kg (210 lb 8.6 oz) IBW/kg (Calculated) : 66.1  Temp (24hrs), Avg:99.2 F (37.3 C), Min:98.3 F (36.8 C), Max:100.6 F (38.1 C)  Recent Labs  Lab 09/26/19 0218 09/26/19 0218 09/27/19 0412 09/28/19 0506 09/29/19 0442 09/29/19 1430 09/30/19 0237  WBC 17.0*   < > 17.4* 14.1* 12.0* 23.0* 19.7*  CREATININE 2.53*  --  2.60* 2.47* 2.26*  --  3.27*   < > = values in this interval not displayed.    Estimated Creatinine Clearance: 24.2 mL/min (A) (by C-G formula based on SCr of 3.27 mg/dL (H)).    No Known Allergies  Antimicrobials this admission: Cefepime 8/16 >> 8/22 Vancomycin 8/13 >> 8/15  Microbiology results: 8/10 BCx: ngtd 8/11 Resp Cx: normal resp flora 8/11 Sputum (@outside  facility): ngtd    10/11, PharmD PGY-1 Acute Care Pharmacy Resident 09/30/2019 7:30 AM  Please check [email or web address] for unit-specific pharmacy phone numbers.

## 2019-09-30 NOTE — Plan of Care (Signed)
  Problem: Clinical Measurements: Goal: Will remain free from infection Outcome: Progressing   Problem: Nutrition: Goal: Adequate nutrition will be maintained Outcome: Progressing   Problem: Coping: Goal: Level of anxiety will decrease Outcome: Progressing   Problem: Pain Managment: Goal: General experience of comfort will improve Outcome: Progressing   Problem: Safety: Goal: Ability to remain free from injury will improve Outcome: Progressing   Problem: Skin Integrity: Goal: Risk for impaired skin integrity will decrease Outcome: Progressing   Problem: Clinical Measurements: Goal: Ability to maintain clinical measurements within normal limits will improve Outcome: Not Progressing Goal: Diagnostic test results will improve Outcome: Not Progressing Goal: Respiratory complications will improve Outcome: Not Progressing Goal: Cardiovascular complication will be avoided Outcome: Not Progressing   Problem: Activity: Goal: Risk for activity intolerance will decrease Outcome: Not Progressing   Problem: Elimination: Goal: Will not experience complications related to bowel motility Outcome: Not Progressing Goal: Will not experience complications related to urinary retention Outcome: Not Progressing   Problem: Education: Goal: Knowledge of General Education information will improve Description: Including pain rating scale, medication(s)/side effects and non-pharmacologic comfort measures Outcome: Not Applicable   Problem: Health Behavior/Discharge Planning: Goal: Ability to manage health-related needs will improve Outcome: Not Applicable

## 2019-09-30 NOTE — Progress Notes (Signed)
ANTICOAGULATION CONSULT NOTE  Pharmacy Consult for heparin Indication: pulmonary embolus  No Known Allergies  Patient Measurements: Height: 5\' 7"  (170.2 cm) Weight: 95.5 kg (210 lb 8.6 oz) IBW/kg (Calculated) : 66.1 Heparin Dosing Weight: 86 kg  Vital Signs: Temp: 98.3 F (36.8 C) (08/18 0400) Temp Source: Oral (08/18 0400) Pulse Rate: 100 (08/18 0600)  Labs: Recent Labs    09/28/19 0506 09/28/19 0510 09/29/19 0442 09/29/19 0453 09/29/19 1430 09/29/19 1541 09/29/19 2217 09/30/19 0237 09/30/19 0237 09/30/19 0434 09/30/19 0657  HGB 10.5*   < > 10.9*   < > 11.2*   < >  --  10.1*   < > 10.5* 9.9*  HCT 34.4*   < > 35.9*   < > 37.6*   < >  --  33.0*  --  31.0* 29.0*  PLT 355   < > 345  --  392  --   --  335  --   --   --   APTT  --   --   --   --  24  --   --   --   --   --   --   LABPROT  --   --   --   --  15.4*  --   --   --   --   --   --   INR  --   --   --   --  1.3*  --   --   --   --   --   --   HEPARINUNFRC  --   --   --   --   --   --  0.53 0.59  --   --   --   CREATININE 2.47*  --  2.26*  --   --   --   --  3.27*  --   --   --    < > = values in this interval not displayed.    Estimated Creatinine Clearance: 24.2 mL/min (A) (by C-G formula based on SCr of 3.27 mg/dL (H)).   Assessment: 69 yo male with possible PE s/p alteplase for heparin  AM HL 0.59, CBC stable - will continue drip at current rate of 1100 units/hr.   Goal of Therapy:  Heparin level 0.3-0.5 units/ml Monitor platelets by anticoagulation protocol: Yes   Plan:  Continue heparin drip at 1100 units/hr Monitor daily HL  79, PharmD PGY-1 Acute Care Pharmacy Resident 09/30/2019 7:23 AM

## 2019-09-30 NOTE — Procedures (Signed)
Cortrak  Person Inserting Tube:  Osa Craver, RD Tube Type:  Cortrak - 43 inches Tube Location:  Left nare Initial Placement:  Postpyloric Secured by: Bridle Technique Used to Measure Tube Placement:  Documented cm marking at nare/ corner of mouth Cortrak Secured At:  88 cm Procedure Comments:  Cortrak Tube Team Note:  Consult received to place a Cortrak feeding tube.   X-ray is required to confirm post-pyloric placement, abdominal x-ray has been ordered by the Cortrak team. Please confirm tube placement before using the Cortrak tube.   If the tube becomes dislodged please keep the tube and contact the Cortrak team at [email or web address] ([email or web address]) for replacement.  If after hours and replacement cannot be delayed, place a NG tube and confirm placement with an abdominal x-ray.    Romelle Starcher MS, RDN, LDN, CNSC Registered Dietitian III Clinical Nutrition RD Pager and On-Call Pager Number Located in [place]

## 2019-09-30 NOTE — Progress Notes (Addendum)
Nutrition Follow-up  DOCUMENTATION CODES:   Not applicable  INTERVENTION:   If vomiting continues recommend addition of prokinetic agent.   Tube Feeding -Vital 1.5 at 20 ml/hr via post-pyloric Cortrak -Increase by 10 ml Q6 hours to goal rate of 60 ml/hr (1440 ml) -Pro-Source TF 45 mL QID Provides 2320 kcals, 141 g of protein and 1100 mL of free water Meets 100% protein needs  TF regimen and propofol at current rate providing 2555 total kcal/day (100 % of kcal needs)  Free water 200 mL q 8 hours: total of 1700 mL of free water with TF plus free water flush; follow fluid status, renal function. Likely need to increase free water  NUTRITION DIAGNOSIS:   Increased nutrient needs related to acute illness as evidenced by estimated needs.  Being addressed via TF   GOAL:   Patient will meet greater than or equal to 90% of their needs  Progressing  MONITOR:   Diet advancement, Vent status, Skin, TF tolerance, Weight trends, Labs, I & O's  REASON FOR ASSESSMENT:   Ventilator    ASSESSMENT:   Patient with PMH significant for DM, ETOH use, and recent COVID diagnosis on 7/21. On 8/10 pt was transferred to [facility] for continued care and on 8/13 was found to be in acute respiratory distress requiring intubation.  7/21-8/10 Admitted to OSH 8/10 Admitted to [facility] 8/13 Transfered to [facility] ICU, Intubated  Had sudden deterioration yesterday, improved after TPA. Restarted on nimbex today. Proning held last night and no plans to prone today. Per RN, pt had multiple vomiting episodes upon initiation of tube feeding and med pushes. Had post pyloric Cortrak placed at bedside this afternoon. Titrate tube feeding slowly to goal. If vomiting continues recommend addition of prokinetic agent.   GOC discussions are ongoing. Pt made DNR yesterday.   Admission weight: 89.9 kg  Current weight: 95.5 kg   Patient remains intubated on ventilator support MV: 18.4  L/min Temp (24hrs), Avg:98.1 F (36.7 C), Min:97.8 F (36.6 C), Max:98.3 F (36.8 C)  Propofol: 10.8 ml/hr- provides 285 kcal from lipids daily  I/O: +5,571 ml since admit  UOP: 845 ml x 24 hrs  Stool: 200 ml x 24 hrs   Drips: nimbex, levophed, propofol, vasopressin Medications: solumedrol, SS novolog, miralax Labs: Phosphorus 5.4 (H) Mg 3.4 (H) Cr 3.27- trending up BUN-trending up CBG 105-220  Diet Order:   Diet Order    None      EDUCATION NEEDS:   Not appropriate for education at this time  Skin:  Skin Assessment: Reviewed RN Assessment  Last BM:  8/18 via rectal tube  Height:   Ht Readings from Last 1 Encounters:  09/15/2019 5\' 7"  (1.702 m)    Weight:   Wt Readings from Last 1 Encounters:  09/30/19 95.5 kg     BMI:  Body mass index is 32.98 kg/m.  Estimated Nutritional Needs:   Kcal:  2500-2700 kcals  Protein:  127-153 g  Fluid:  >/= 2 L/day  10/02/19 RD, LDN Clinical Nutrition Pager listed in AMION

## 2019-09-30 NOTE — Progress Notes (Signed)
Confirmed with Dr. Wendall Mola starting tube feeds today. Will address in am. OG LWIS with brown in tubing.

## 2019-09-30 NOTE — Progress Notes (Signed)
eLink Physician-Brief Progress Note Patient Name: Manvir Prabhu DOB: May 01, 1950 MRN: [id number]   Date of Service  09/30/2019  HPI/Events of Note  ABG on 80%/PC 30/Rate 35/P 8 = 7.22/72.3/? Patient overbreathing the set ventilator rate at 36-44. Sat currently = 100%.  eICU Interventions  Plan: 1. Restart Propofol IV infusion. Titrate to RASS = 0 to -1.      Intervention Category Major Interventions: Respiratory failure - evaluation and management  Sharian Delia Eugene 09/30/2019, 5:33 AM

## 2019-10-01 LAB — POCT I-STAT 7, (LYTES, BLD GAS, ICA,H+H)
Acid-base deficit: 5 mmol/L — ABNORMAL HIGH (ref 0.0–2.0)
Bicarbonate: 25.1 mmol/L (ref 20.0–28.0)
Calcium, Ion: 1.2 mmol/L (ref 1.15–1.40)
HCT: 32 % — ABNORMAL LOW (ref 39.0–52.0)
Hemoglobin: 10.9 g/dL — ABNORMAL LOW (ref 13.0–17.0)
O2 Saturation: 84 %
Patient temperature: 97.6
Potassium: 5.8 mmol/L — ABNORMAL HIGH (ref 3.5–5.1)
Sodium: 142 mmol/L (ref 135–145)
TCO2: 27 mmol/L (ref 22–32)
pCO2 arterial: 70.4 mmHg (ref 32.0–48.0)
pH, Arterial: 7.157 — CL (ref 7.350–7.450)
pO2, Arterial: 61 mmHg — ABNORMAL LOW (ref 83.0–108.0)

## 2019-10-01 LAB — CBC
HCT: 37.3 % — ABNORMAL LOW (ref 39.0–52.0)
Hemoglobin: 10.7 g/dL — ABNORMAL LOW (ref 13.0–17.0)
MCH: 28.6 pg (ref 26.0–34.0)
MCHC: 28.7 g/dL — ABNORMAL LOW (ref 30.0–36.0)
MCV: 99.7 fL (ref 80.0–100.0)
Platelets: 366 10*3/uL (ref 150–400)
RBC: 3.74 MIL/uL — ABNORMAL LOW (ref 4.22–5.81)
RDW: 15.6 % — ABNORMAL HIGH (ref 11.5–15.5)
WBC: 24.9 10*3/uL — ABNORMAL HIGH (ref 4.0–10.5)
nRBC: 2.2 % — ABNORMAL HIGH (ref 0.0–0.2)

## 2019-10-01 LAB — TRIGLYCERIDES: Triglycerides: 292 mg/dL — ABNORMAL HIGH (ref <150)

## 2019-10-01 LAB — BASIC METABOLIC PANEL
Anion gap: 10 (ref 5–15)
BUN: 163 mg/dL — ABNORMAL HIGH (ref 8–23)
CO2: 27 mmol/L (ref 22–32)
Calcium: 8.8 mg/dL — ABNORMAL LOW (ref 8.9–10.3)
Chloride: 105 mmol/L (ref 98–111)
Creatinine, Ser: 4.28 mg/dL — ABNORMAL HIGH (ref 0.61–1.24)
GFR calc Af Amer: 15 mL/min — ABNORMAL LOW (ref >60)
GFR calc non Af Amer: 13 mL/min — ABNORMAL LOW (ref >60)
Glucose, Bld: 292 mg/dL — ABNORMAL HIGH (ref 70–99)
Potassium: 6.2 mmol/L — ABNORMAL HIGH (ref 3.5–5.1)
Sodium: 142 mmol/L (ref 135–145)

## 2019-10-01 LAB — HEPARIN LEVEL (UNFRACTIONATED)
Heparin Unfractionated: 0.76 IU/mL — ABNORMAL HIGH (ref 0.30–0.70)
Heparin Unfractionated: 0.52 IU/mL (ref 0.30–0.70)

## 2019-10-01 LAB — GLUCOSE, CAPILLARY
Glucose-Capillary: 247 mg/dL — ABNORMAL HIGH (ref 70–99)
Glucose-Capillary: 265 mg/dL — ABNORMAL HIGH (ref 70–99)
Glucose-Capillary: 272 mg/dL — ABNORMAL HIGH (ref 70–99)
Glucose-Capillary: 309 mg/dL — ABNORMAL HIGH (ref 70–99)

## 2019-10-01 LAB — PHOSPHORUS: Phosphorus: >30 mg/dL — ABNORMAL HIGH (ref 2.5–4.6)

## 2019-10-01 LAB — MAGNESIUM: Magnesium: 4.3 mg/dL — ABNORMAL HIGH (ref 1.7–2.4)

## 2019-10-01 MED ORDER — ACETAMINOPHEN 650 MG RE SUPP: 650.0000 mg | Freq: Four times a day (QID) | RECTAL | Status: DC | PRN

## 2019-10-01 MED ORDER — FUROSEMIDE 10 MG/ML IJ SOLN
80.0000 mg | Freq: Once | INTRAMUSCULAR | Status: AC
  Administered 2019-10-01: 80 mg via INTRAVENOUS
  Filled 2019-10-01: qty 8

## 2019-10-01 MED ORDER — MIDAZOLAM 50MG/50ML (1MG/ML) PREMIX INFUSION: 0.0000 mg/h | INTRAVENOUS | Status: DC

## 2019-10-01 MED ORDER — FENTANYL 2500MCG IN NS 250ML (10MCG/ML) PREMIX INFUSION: 0.0000 ug/h | INTRAVENOUS | Status: DC

## 2019-10-01 MED ORDER — MIDAZOLAM HCL 2 MG/2ML IJ SOLN: 1.0000 mg | INTRAMUSCULAR | Status: DC | PRN

## 2019-10-01 MED ORDER — FENTANYL CITRATE (PF) 100 MCG/2ML IJ SOLN: 50.0000 ug | INTRAMUSCULAR | Status: DC | PRN

## 2019-10-01 MED ORDER — DEXTROSE 5 % IV SOLN: INTRAVENOUS | Status: DC

## 2019-10-01 MED ORDER — GLYCOPYRROLATE 0.2 MG/ML IJ SOLN: 0.2000 mg | INTRAMUSCULAR | Status: DC | PRN

## 2019-10-01 MED ORDER — MIDAZOLAM BOLUS VIA INFUSION (WITHDRAWAL LIFE SUSTAINING TX)
2.0000 mg | INTRAVENOUS | Status: DC | PRN
  Filled 2019-10-01: qty 2

## 2019-10-01 MED ORDER — ACETAMINOPHEN 325 MG PO TABS: 650.0000 mg | ORAL_TABLET | Freq: Four times a day (QID) | ORAL | Status: DC | PRN

## 2019-10-01 MED ORDER — DIPHENHYDRAMINE HCL 50 MG/ML IJ SOLN: 25.0000 mg | INTRAMUSCULAR | Status: DC | PRN

## 2019-10-01 MED ORDER — FENTANYL BOLUS VIA INFUSION
100.0000 ug | INTRAVENOUS | Status: DC | PRN
  Filled 2019-10-01: qty 100

## 2019-10-01 MED ORDER — GLYCOPYRROLATE 1 MG PO TABS
1.0000 mg | ORAL_TABLET | ORAL | Status: DC | PRN
  Filled 2019-10-01: qty 1

## 2019-10-01 MED ORDER — POLYVINYL ALCOHOL 1.4 % OP SOLN
1.0000 [drp] | Freq: Four times a day (QID) | OPHTHALMIC | Status: DC | PRN
  Filled 2019-10-01: qty 15

## 2019-10-01 MED ORDER — SODIUM ZIRCONIUM CYCLOSILICATE 10 G PO PACK
10.0000 g | PACK | Freq: Once | ORAL | Status: AC
  Administered 2019-10-01: 10 g
  Filled 2019-10-01: qty 1

## 2019-10-14 NOTE — Progress Notes (Signed)
NAME:  Brian Guerra, MRN:  [id number], DOB:  1950/06/02, LOS: 6 ADMISSION DATE:  06-Oct-2019, CONSULTATION DATE:  10-06-19 REFERRING MD:  [facility], CHIEF COMPLAINT:  Acute Hypoxic Respiratory Failure    History of present illness   69 year old male, diagnosed with COVID on 7/21 at OSH. Completed 5 days of Remdesivir, 10 days of Decadron, and 5 days of Solu-medrol. Stay complicated by progressive hypoxia, Streptococcal PNA. Completed 5 day course of Azithromycin/Rocephin/Cefdinir. Enterococcus UTI treated with Ampicillin and then changed to amoxicillin. Acute Renal Failure with Rhabdomyolysis. And Transaminitis.   On 8/10 patient was transferred to [facility] for continued care. Patient remained on 40L High Flow Oxygen. On 8/13 patient was found to be in severe respiratory distress requiring intubation. Question aspiration event, patient with emesis before event. Transferred ICU.   Past Medical History  DM, ETOH/Tobacco Use (Quit 15 years ago)   Significant Hospital Events   7/21-8/10 > Admit to OSH  8/13 > Intubated and Transferred to ICU  8/17> Desaturation, hypotensive, tachycardic to the 150s. tpa initiated with improvement.  Consults:  PCCM  Procedures:  ETT 8/13 >>  Significant Diagnostic Tests:  CXR 8/14> diffuse bilateral ASD   Micro Data:  Blood 8/13 >> Urine 8/13 >> Sputum 8/13 >> NF  Antimicrobials:  Cefepime 8/16>  Vanc 8/13>8/15 Interim history/subjective:   Spoke with nursing at bedside, ON his   Objective   Blood pressure 137/90, pulse 94, temperature (!) 96.6 F (35.9 C), temperature source Oral, resp. rate (!) 35, height 5\' 7"  (1.702 m), weight 96.2 kg, SpO2 96 %.    Vent Mode: PCV FiO2 (%):  [80 %-100 %] 100 % Set Rate:  [30 bmp-35 bmp] 30 bmp Vt Set:  [30 mL] 30 mL PEEP:  [8 cmH20] 8 cmH20 Plateau Pressure:  [27 cmH20-35 cmH20] 27 cmH20   Intake/Output Summary (Last 24 hours) at 09/15/2019 0933 Last data filed at 09/20/2019 0920 Gross  per 24 hour  Intake 2335.35 ml  Output 725 ml  Net 1610.35 ml   Filed Weights   09/29/19 0500 09/30/19 0413 10/06/2019 0322  Weight: 94.3 kg 95.5 kg 96.2 kg   Physical Exam: Physical Exam Constitutional:      Comments: Sedated and intubated.   HENT:     Head: Normocephalic.  Cardiovascular:     Rate and Rhythm: Normal rate and regular rhythm.     Pulses: Normal pulses.     Heart sounds: Normal heart sounds. No murmur heard.  No friction rub. No gallop.   Pulmonary:     Comments: Coarse breath sounds appreciated bilaterally.  Abdominal:     General: Abdomen is flat. Bowel sounds are normal.     Tenderness: There is no abdominal tenderness.     Resolved Hospital Problem list     Assessment & Plan:   ARDS secondary COVID pneumonia and Strep PNA (OSH Cx) He is 3.5 weeks post positive COVID test. Leukocytosis increased to 25 ON, no fever events ON. Clinical picture continues to decline, his UOP continues to decline and kidney function worsening, requires increased pressor support, conversations with family have indicated that he is DNR and that further aggressive care includes hemodialysis, which may be needed due to his hyperkalemia. Palliative care may be needed for further GOC discussion with patient's wife. Mr. Robert's daughter is on board.   S/p Ceftriaxone, Cedfinir, azithro x 5 days at OSH S/p remdesivir, decadron and solumedrol at OSH - Further family GOC discussions palliative care ordered.  -  Continue albuterol  - Wean off IV steroids as ordered  - Keep fluid even as able, net even - Continue cefepime, stop date 8/22  - ABG  Acute Kidney Injury, suspected secondary to tubular necrosis  Cr of 4.28 today, poor UOP.   Hyperkalemia:  K: 6.2, given lokelma on  - continue to trend BMP - Start lokelma 10 mg QD.   DM Plan -Trend Glucose  -SSI   High TF residuals, N/V - Cortrak post pyloric - Continue tube feeds.   Acute right heart strain in setting of ARDS,  likely aspiration Started Full dose AC heparin 8/17, additional tpa given after acutely decompensated with bedside US R heart strain, tachy in the 150s and desaturating into the low 80s.  - Continue Heparin.   Best practice:  Diet: on hold Pain/Anxiety/Delirium protocol (if indicated): as above VAP protocol (if indicated):yes  DVT prophylaxis: Heparin sq  GI prophylaxis: PPI Glucose control: SSI Mobility: Bedrest  Code Status: Full  Family Communication: will update Disposition: ICU   Dolan Amen, MD IMTS, PGY-2 10/06/2019,9:33 AM

## 2019-10-14 NOTE — Significant Event (Signed)
Postmortem care done by nursing staff; patient taken to the morgue.  Wallis Bamberg, RN

## 2019-10-14 NOTE — Procedures (Signed)
Extubation Procedure Note  Patient Details:   Name: Brian Guerra DOB: 10/07/1950 MRN: [id number]   Airway Documentation:    Vent end date: 09/14/2019 Vent end time: 1438   Evaluation  O2 sats: stable throughout Complications: No apparent complications Patient did tolerate procedure well. Bilateral Breath Sounds: Diminished   No   Pt was extubated to comfort care measures per MD order and family request.  Glenis Smoker Shaconda Hajduk 09/28/2019, 2:48 PM

## 2019-10-14 NOTE — Progress Notes (Signed)
ANTICOAGULATION CONSULT NOTE  Pharmacy Consult for heparin Indication: pulmonary embolus  No Known Allergies  Patient Measurements: Height: 5\' 7"  (170.2 cm) Weight: 96.2 kg (212 lb 1.3 oz) IBW/kg (Calculated) : 66.1 Heparin Dosing Weight: 86 kg  Vital Signs: Temp: 97.9 F (36.6 C) (08/19 0000) Temp Source: Oral (08/19 0000) BP: 137/90 (08/19 0300) Pulse Rate: 108 (08/19 0356)  Labs: Recent Labs    09/28/19 0506 09/28/19 0510 09/29/19 0442 09/29/19 0453 09/29/19 1430 09/29/19 1541 09/29/19 2217 09/30/19 0237 09/30/19 0237 09/30/19 0434 09/30/19 0434 09/30/19 0657 09/30/19 1300 21-Oct-2019 0327  HGB 10.5*   < > 10.9*   < > 11.2*   < >  --  10.1*   < > 10.5*   < > 9.9* 10.5*  --   HCT 34.4*   < > 35.9*   < > 37.6*   < >  --  33.0*   < > 31.0*  --  29.0* 31.0*  --   PLT 355   < > 345  --  392  --   --  335  --   --   --   --   --   --   APTT  --   --   --   --  24  --   --   --   --   --   --   --   --   --   LABPROT  --   --   --   --  15.4*  --   --   --   --   --   --   --   --   --   INR  --   --   --   --  1.3*  --   --   --   --   --   --   --   --   --   HEPARINUNFRC  --   --   --   --   --   --  0.53 0.59  --   --   --   --   --  0.76*  CREATININE 2.47*  --  2.26*  --   --   --   --  3.27*  --   --   --   --   --   --    < > = values in this interval not displayed.    Estimated Creatinine Clearance: 24.2 mL/min (A) (by C-G formula based on SCr of 3.27 mg/dL (H)).   Assessment: 69 yo male with possible PE s/p alteplase for heparin  Heparin level supratherapeutic this morning at 0.76 on 1100 units/hr. Spoke with RN and no problems or bleeding with infusion. CBC not drawn yet - will attempt to add on.   Goal of Therapy:  Heparin level 0.3-0.7 units/ml Monitor platelets by anticoagulation protocol: Yes   Plan:  Decrease heparin drip to 1000 units/hr 8 hr heparin level F/U CBC Monitor daily heparin level, CBC  Thank you for involving pharmacy in this  patient's care.  80, PharmD, BCPS Clinical Pharmacist Oct 21, 2019 4:22 AM  **Pharmacist phone directory can be found on [email or web address] listed under [facility]**

## 2019-10-14 NOTE — Progress Notes (Signed)
eLink Physician-Brief Progress Note Patient Name: Esaias Cleavenger DOB: Dec 27, 1950 MRN: [id number]   Date of Service  10-30-2019  HPI/Events of Note  K+ 6.2  eICU Interventions  Lokelma 10 gm via NG tube x 1        Jeric Slagel U Hendel Gatliff 2019-10-30, 5:50 AM

## 2019-10-14 NOTE — Progress Notes (Signed)
Nitric oxide turned off prior to extubation, pt's sats immediately dropped 70's. Pt extubated 1438. Time of death called at 1446 No heart or lung sounds auscultated for one full minute by Oren Bracket RN and verified by Kathyrn Drown RN.  Heather pt's daughter asked to be called after pt had passed. Daughter called and made aware. She informed this RN she would let the other family members know. Family is from out of town and did not wish to be present at time of comfort care measures implented.

## 2019-10-14 NOTE — Progress Notes (Signed)
PCCM team reached out to Brian Guerra's daughter, Herbert Seta, and his wife, Elita Quick, regarding his poor clinical course. We discussed his failing kidneys and worsening hypercarbic respiratory failure. Despite maximum therapy and medications, he continues to decline, and his slim chance of survival. Per our previous discussion, both are in agreement that escalating to dialysis would not be what he would want. After continued discussion family agree to make Mr. [place] FULL COMFORT CARE.  Dolan Amen, MD IMTS, PGY-2 10/07/2019,2:38 PM

## 2019-10-14 NOTE — Progress Notes (Signed)
ANTICOAGULATION CONSULT NOTE  Pharmacy Consult for heparin Indication: pulmonary embolus  No Known Allergies  Patient Measurements: Height: 5\' 7"  (170.2 cm) Weight: 96.2 kg (212 lb 1.3 oz) IBW/kg (Calculated) : 66.1 Heparin Dosing Weight: 86 kg  Vital Signs: Temp: 97.6 F (36.4 C) (08/19 1114) Temp Source: Oral (08/19 1114) BP: 137/90 (08/19 0300) Pulse Rate: 111 (08/19 1300)  Labs: Recent Labs    09/29/19 0442 09/29/19 0453 09/29/19 1430 09/29/19 1541 09/30/19 0237 09/30/19 0434 09/30/19 1300 09/30/19 1300 09/21/2019 0327 09/18/2019 0437 09/22/2019 1111 09/17/2019 1202  HGB 10.9*   < > 11.2*   < > 10.1*   < > 10.5*   < >  --  10.7* 10.9*  --   HCT 35.9*   < > 37.6*   < > 33.0*   < > 31.0*  --   --  37.3* 32.0*  --   PLT 345   < > 392  --  335  --   --   --   --  366  --   --   APTT  --   --  24  --   --   --   --   --   --   --   --   --   LABPROT  --   --  15.4*  --   --   --   --   --   --   --   --   --   INR  --   --  1.3*  --   --   --   --   --   --   --   --   --   HEPARINUNFRC  --   --   --    < > 0.59  --   --   --  0.76*  --   --  0.52  CREATININE 2.26*  --   --   --  3.27*  --   --   --  4.28*  --   --   --    < > = values in this interval not displayed.    Estimated Creatinine Clearance: 18.5 mL/min (A) (by C-G formula based on SCr of 4.28 mg/dL (H)).   Assessment: 69 yo male with possible PE s/p alteplase for heparin  HL overnight was supratherapeutic and drip was decreased slightly.  PM HL therapeutic at 0.52, CBC stable - will continue drip at current rate of 1000 units/hr and check daily HL   Goal of Therapy:  Heparin level 0.3-0.5 units/ml Monitor platelets by anticoagulation protocol: Yes   Plan:  Continue heparin drip at 1000 units/hr Monitor daily HL Monitor CBC  79, PharmD PGY-1 Acute Care Pharmacy Resident 10/05/2019 1:35 PM

## 2019-10-14 NOTE — Death Summary Note (Addendum)
Physician Death Summary         Patient ID: Brian Guerra MRN: [id number] DOB/AGE: [birth year] 69 y.o.  Admit date: Sep 27, 2019 Discharge date: October 03, 2019 Time of Death: 14:46 Discharge Diagnoses:   Septic Shock 2/2 to COVID-19 with Superimposed Strep Pneumonia    Death Summary    Brian Guerra is a 69 y/o male who was admitted to the ICU services for hypercarbic respiratory failure from COVID-19 with superimposed streptococcus pneumonia, originally diagnosed at OSH. He was initially treated for his COVID-19 pneumonia a full course of remdesivir, decadron, and solumedrol. His symptoms continued to worsen, and he was found to have streptococcal pneumonia for which he was treated with a 5 day course of azithromycin, rocephin, and cefdinir. He continued to clinically worsen with acute renal failure, rhabdomyolysis, and transaminitis. He was found to be in severe respiratory distress, thought to be due to an aspiration event, requiring intubation on 8/13 and was admitted to the ICU.   On 8/17 [name] suddenly deteriorated and was hypoxic, tachycardic, tachypneic, and TPA was administered for presumed pulmonary embolism, with improvement. Despite his improvement [name] clinical course continued to decline despite full anticoagulation for PE and cefepime for his pneumonia. He continued to be hypercarbic, and hypoxic despite maximal ventilation support. His renal failure worsened with electrolyte imbalances. After several discussions with [name] wife and daughter, it was decided that he should be full comfort care.    Significant Hospital tests/ studies  CXR 8/14> diffuse bilateral ASD   Procedures   ETT 09/27/2019  Culture data/antimicrobials   Blood 8/13 >> Urine 8/13 >> Sputum 8/13 >> NF Cefepime 8/16> stop 8/22 Vanc 8/13>8/15  Labs at discharge   Lab Results  Component Value Date   CREATININE 4.28 (H) 03-Oct-2019   BUN 163 (H) 10-03-19   NA 142 10-03-19   K  5.8 (H) 10/03/2019   CL 105 03-Oct-2019   CO2 27 10-03-19   Lab Results  Component Value Date   WBC 24.9 (H) 10-03-19   HGB 10.9 (L) 2019-10-03   HCT 32.0 (L) 10/03/19   MCV 99.7 October 03, 2019   PLT 366 2019/10/03   Lab Results  Component Value Date   ALT 101 (H) 09/27/2019   AST 29 09/27/2019   ALKPHOS 103 09/27/2019   BILITOT 0.7 09/27/2019   Lab Results  Component Value Date   INR 1.3 (H) 09/29/2019    Current radiological studies    DG Chest 1 View  Result Date: 09/30/2019 CLINICAL DATA:  Hypoxia EXAM: CHEST  1 VIEW COMPARISON:  September 29, 2019 FINDINGS: Endotracheal tube tip is 2.3 cm above the carina. Nasogastric tube tip and side port are below the diaphragm. Central catheter tip is located at the left innominate vein/superior vena cava confluence. No pneumothorax. There is airspace opacity throughout both lungs, slightly more on the left than on the right, essentially stable. No frank consolidation. Heart is upper normal in size with pulmonary vascularity normal. There is aortic atherosclerosis. No bone lesions. IMPRESSION: And catheter positions as described without pneumothorax. Multifocal airspace opacity is similar in appearance compared to 1 day prior. Stable cardiac silhouette. Aortic Atherosclerosis (ICD10-I70.0). Electronically Signed   By: Bretta Bang III M.D.   On: 09/30/2019 08:24   DG Abd Portable 1V  Result Date: 09/30/2019 CLINICAL DATA:  Feeding tube placement. EXAM: PORTABLE ABDOMEN - 1 VIEW COMPARISON:  09/29/2019 FINDINGS: The enteric feeding tube tip is in the fourth portion of the duodenum near the ligament of Treitz.  The endotracheal tube is 3 cm above the carina. Persistent interstitial and airspace process in the lungs. IMPRESSION: Feeding tube tip is in the fourth portion of the duodenum. Electronically Signed   By: Rudie Meyer M.D.   On: 09/30/2019 19:02    Discharge Condition:   Brian Guerra was discharge from [facility] in  expired condition.   Signed: Dolan Amen Oct 30, 2019, 3:36 PM

## 2019-10-14 DEATH — deceased

## 2021-08-19 IMAGING — DX DG CHEST 1V PORT
1 series · 1 of 1 positions shown · non-contrast
Comparison: Chest radiograph dated 09/22/2019.

CLINICAL DATA: 68-year-old male with shortness of breath. Positive
[id number].

EXAM:
PORTABLE CHEST 1 VIEW

[chest]
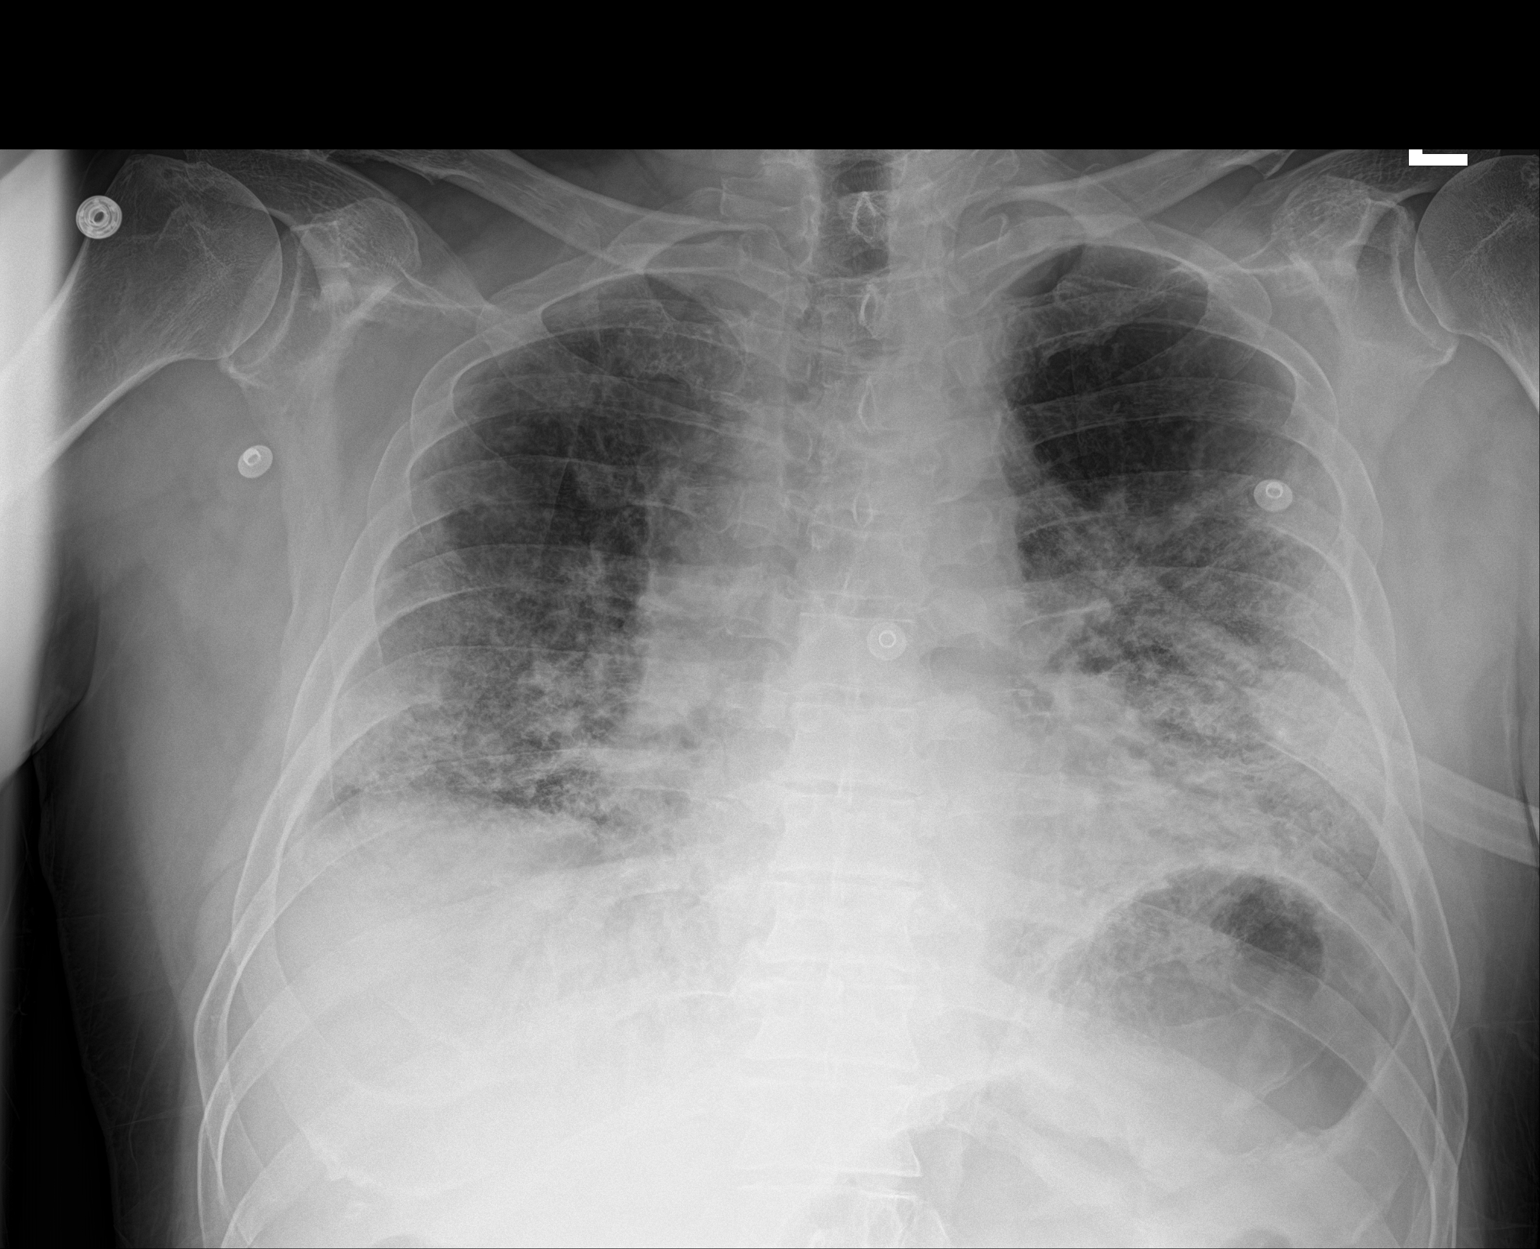

[1 of 1 positions shown; findings below may reference images not displayed]

FINDINGS: Bilateral mid to lower lung field airspace opacities similar or
slightly worsened since the prior radiograph. No pleural effusion or
pneumothorax. Stable cardiac silhouette. Atherosclerotic
calcification of the aorta. No acute osseous pathology.
IMPRESSION: Bilateral mid to lower lung field airspace opacities similar or
slightly worsened since the prior radiograph.

## 2021-08-20 IMAGING — DX DG CHEST 1V PORT
1 series · 1 of 1 positions shown · non-contrast
Comparison: September 25, 2019 study obtained earlier in the day

CLINICAL DATA: Central catheter placement

EXAM:
PORTABLE CHEST 1 VIEW

[chest]
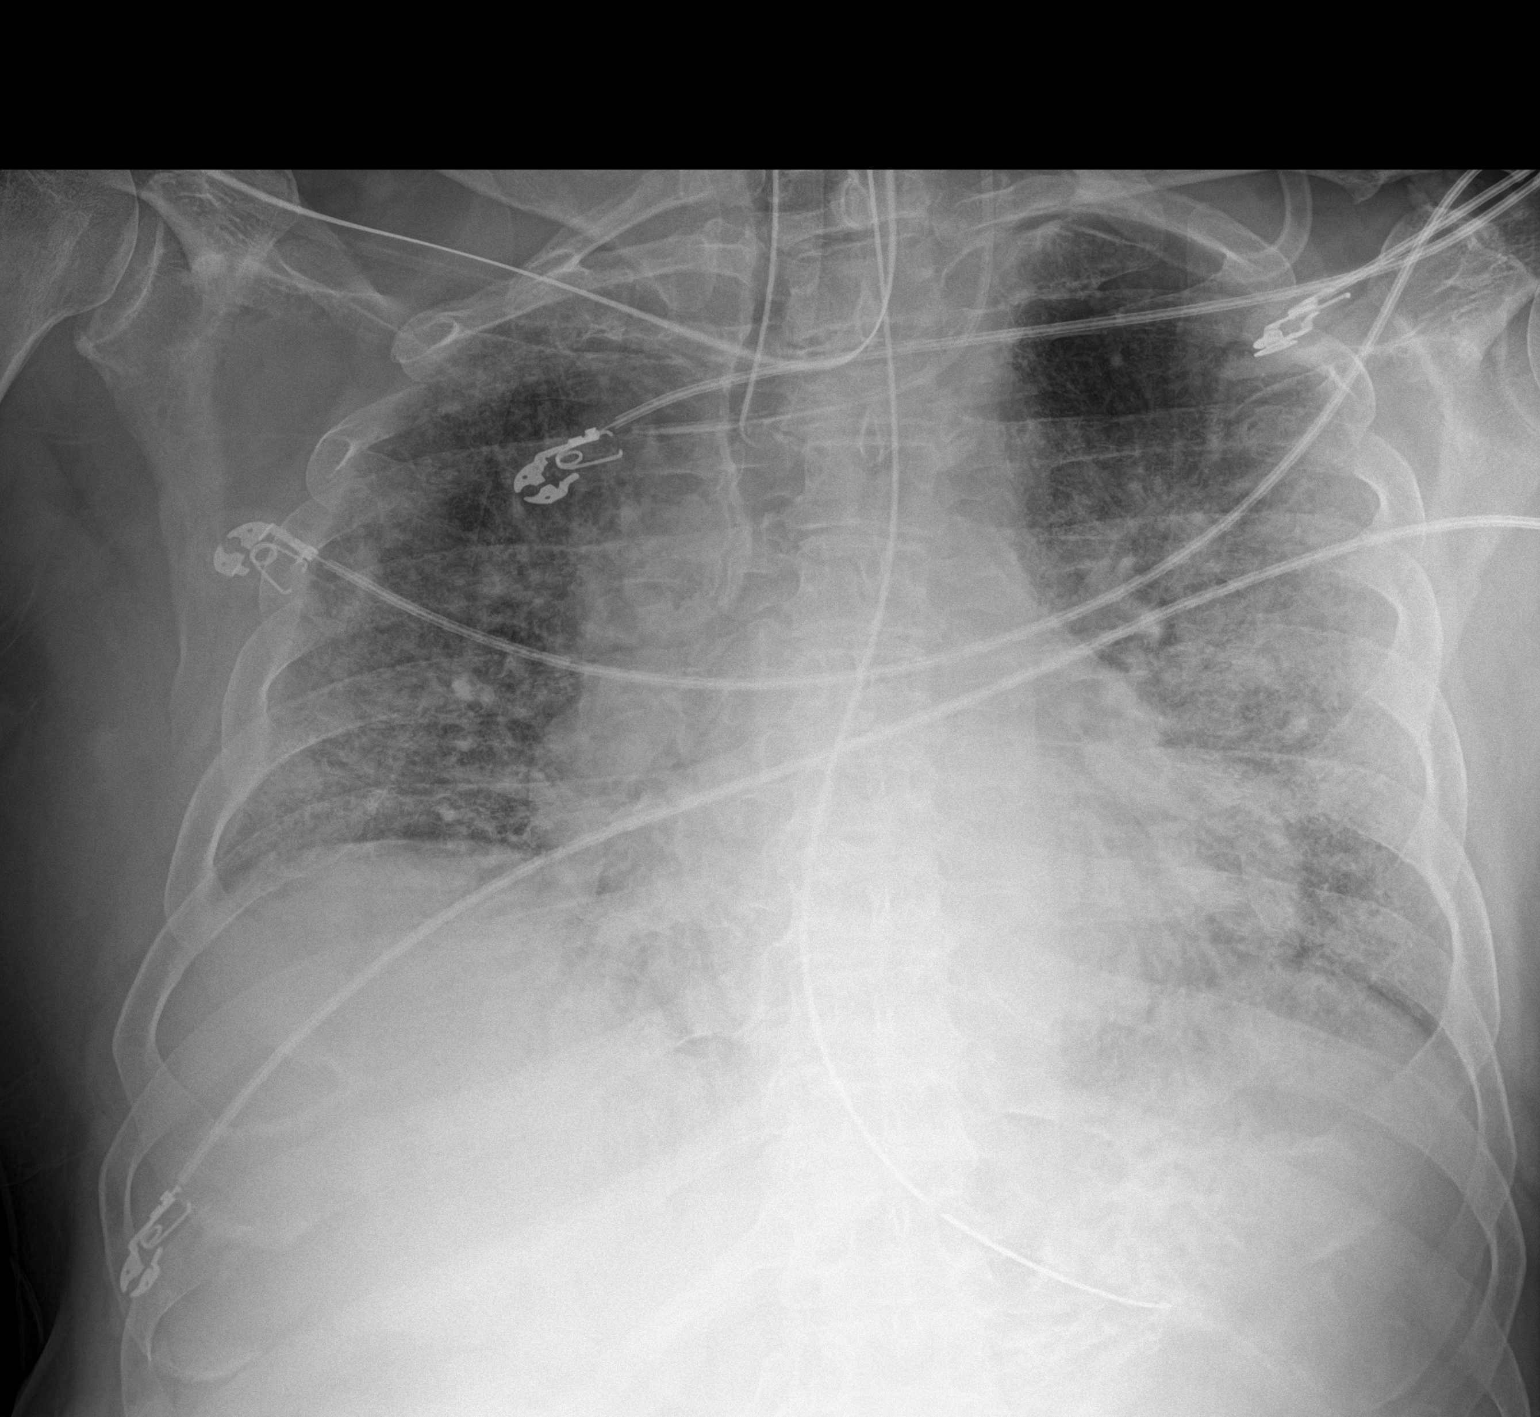

[1 of 1 positions shown; findings below may reference images not displayed]

FINDINGS: Left jugular catheter tip is at the junction of the left innominate
vein and superior vena cava. Endotracheal tube tip is 3.7 cm above
carina. Nasogastric tube tip and side port are in the proximal
stomach. No pneumothorax. There is patchy interstitial and airspace
opacity bilaterally consistent with multifocal pneumonia. Heart is
upper normal in size with pulmonary vascularity normal. No
adenopathy. No bone lesions.
IMPRESSION: Tube and catheter positions as described without evident
pneumothorax. Multifocal airspace interstitial opacity is stable
compared to earlier in the day and is felt to be most consistent
with multifocal pneumonia. A degree of underlying pulmonary edema
cannot be excluded by radiography. Stable cardiac silhouette.

## 2021-08-20 IMAGING — DX DG CHEST 1V PORT
1 series · 1 of 1 positions shown · non-contrast
Comparison: One-view chest x-ray 09/24/2019

CLINICAL DATA: Endotracheal tube placement.  COVID positive.

EXAM:
PORTABLE CHEST 1 VIEW

[chest ap]
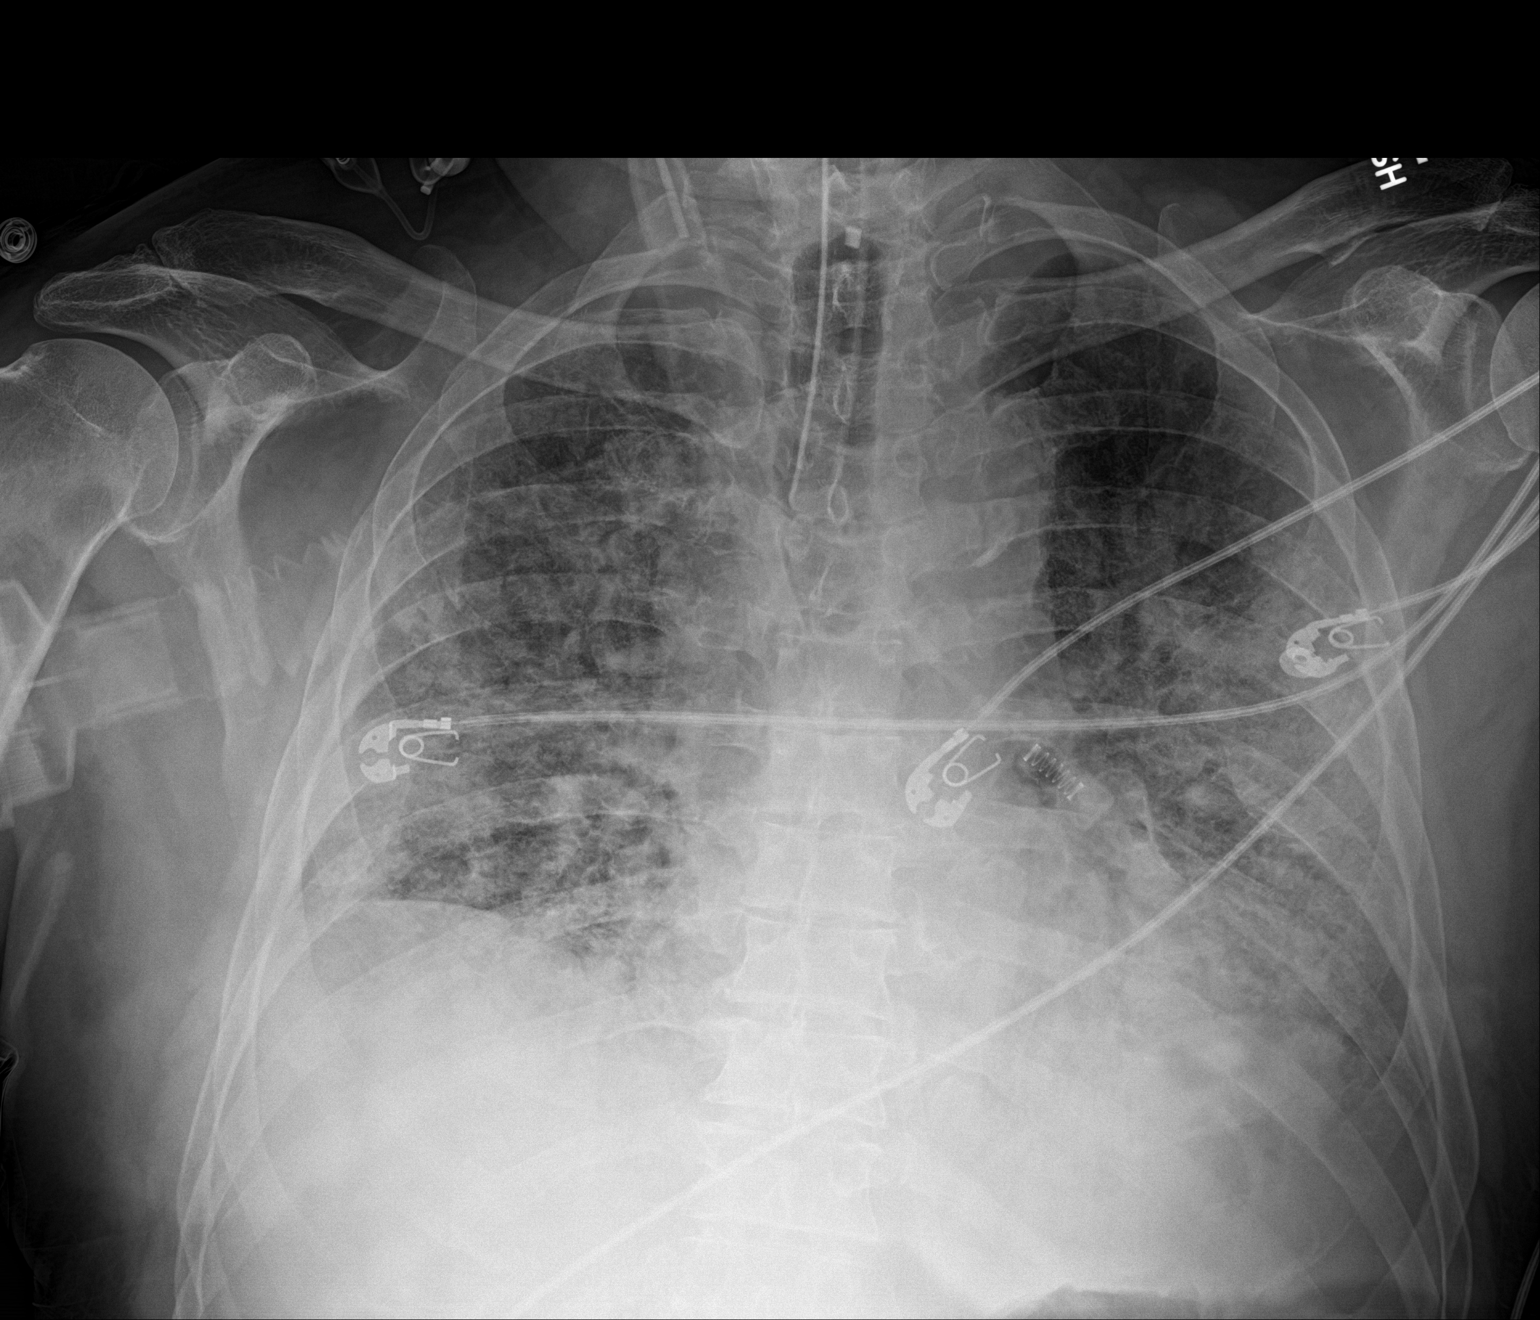

[1 of 1 positions shown; findings below may reference images not displayed]

FINDINGS: Heart size normal. Endotracheal tube terminates 3.4 cm above the
carina. Increasing interstitial and airspace opacities are present
bilaterally. Right pleural effusion is noted.
IMPRESSION: 1. Endotracheal tube terminates 3.4 cm above the carina.
2. Increasing interstitial and airspace disease bilaterally.
Findings are consistent with multifocal pneumonia.

## 2021-08-24 IMAGING — DX DG CHEST 1V
1 series · 1 of 1 positions shown · non-contrast
Comparison: Portable chest 7874 hours today and earlier.

CLINICAL DATA: 68-year-old male with tachycardia.  2FBU8-S5.

EXAM:
CHEST  1 VIEW

[chest]
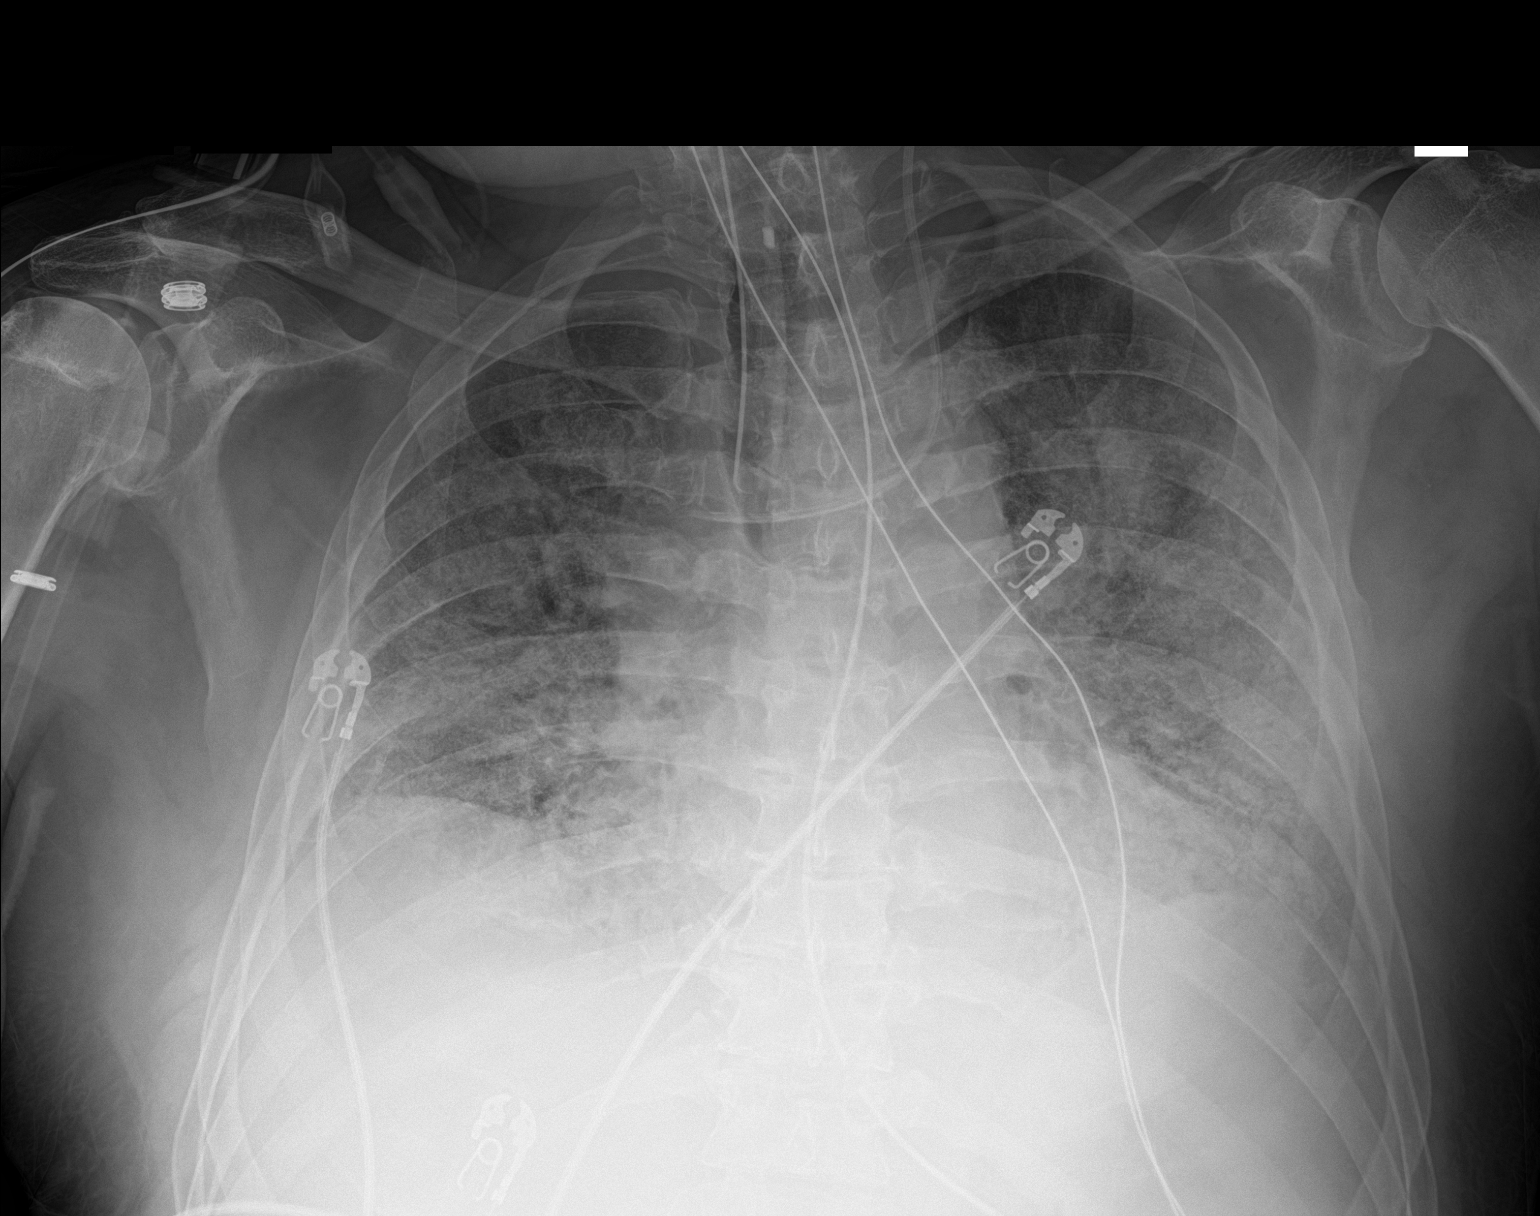

[1 of 1 positions shown; findings below may reference images not displayed]

FINDINGS: Portable AP semi upright view at 4494 hours. Stable lines and tubes.
Mildly lower lung volumes. Stable cardiac size and mediastinal
contours. Diffuse coarse pulmonary interstitial opacity, with
increased confluence now about the left hilum. No pneumothorax. No
pleural effusion. Paucity of bowel gas in the upper abdomen. No
acute osseous abnormality identified.
IMPRESSION: 1. Stable lines and tubes.  No pneumothorax.
2. Bilateral 2FBU8-S5 pneumonia with mildly increased confluence in
the left lung from earlier today, although somewhat lower lung
volume also.

## 2021-08-25 IMAGING — DX DG ABD PORTABLE 1V
1 series · 2 of 2 positions shown · non-contrast
Comparison: 09/29/2019

CLINICAL DATA: Feeding tube placement.

EXAM:
PORTABLE ABDOMEN - 1 VIEW

[Series 1: abdomen · 0.14mm/px · 2 of 2 slices shown]
[im 1/2]
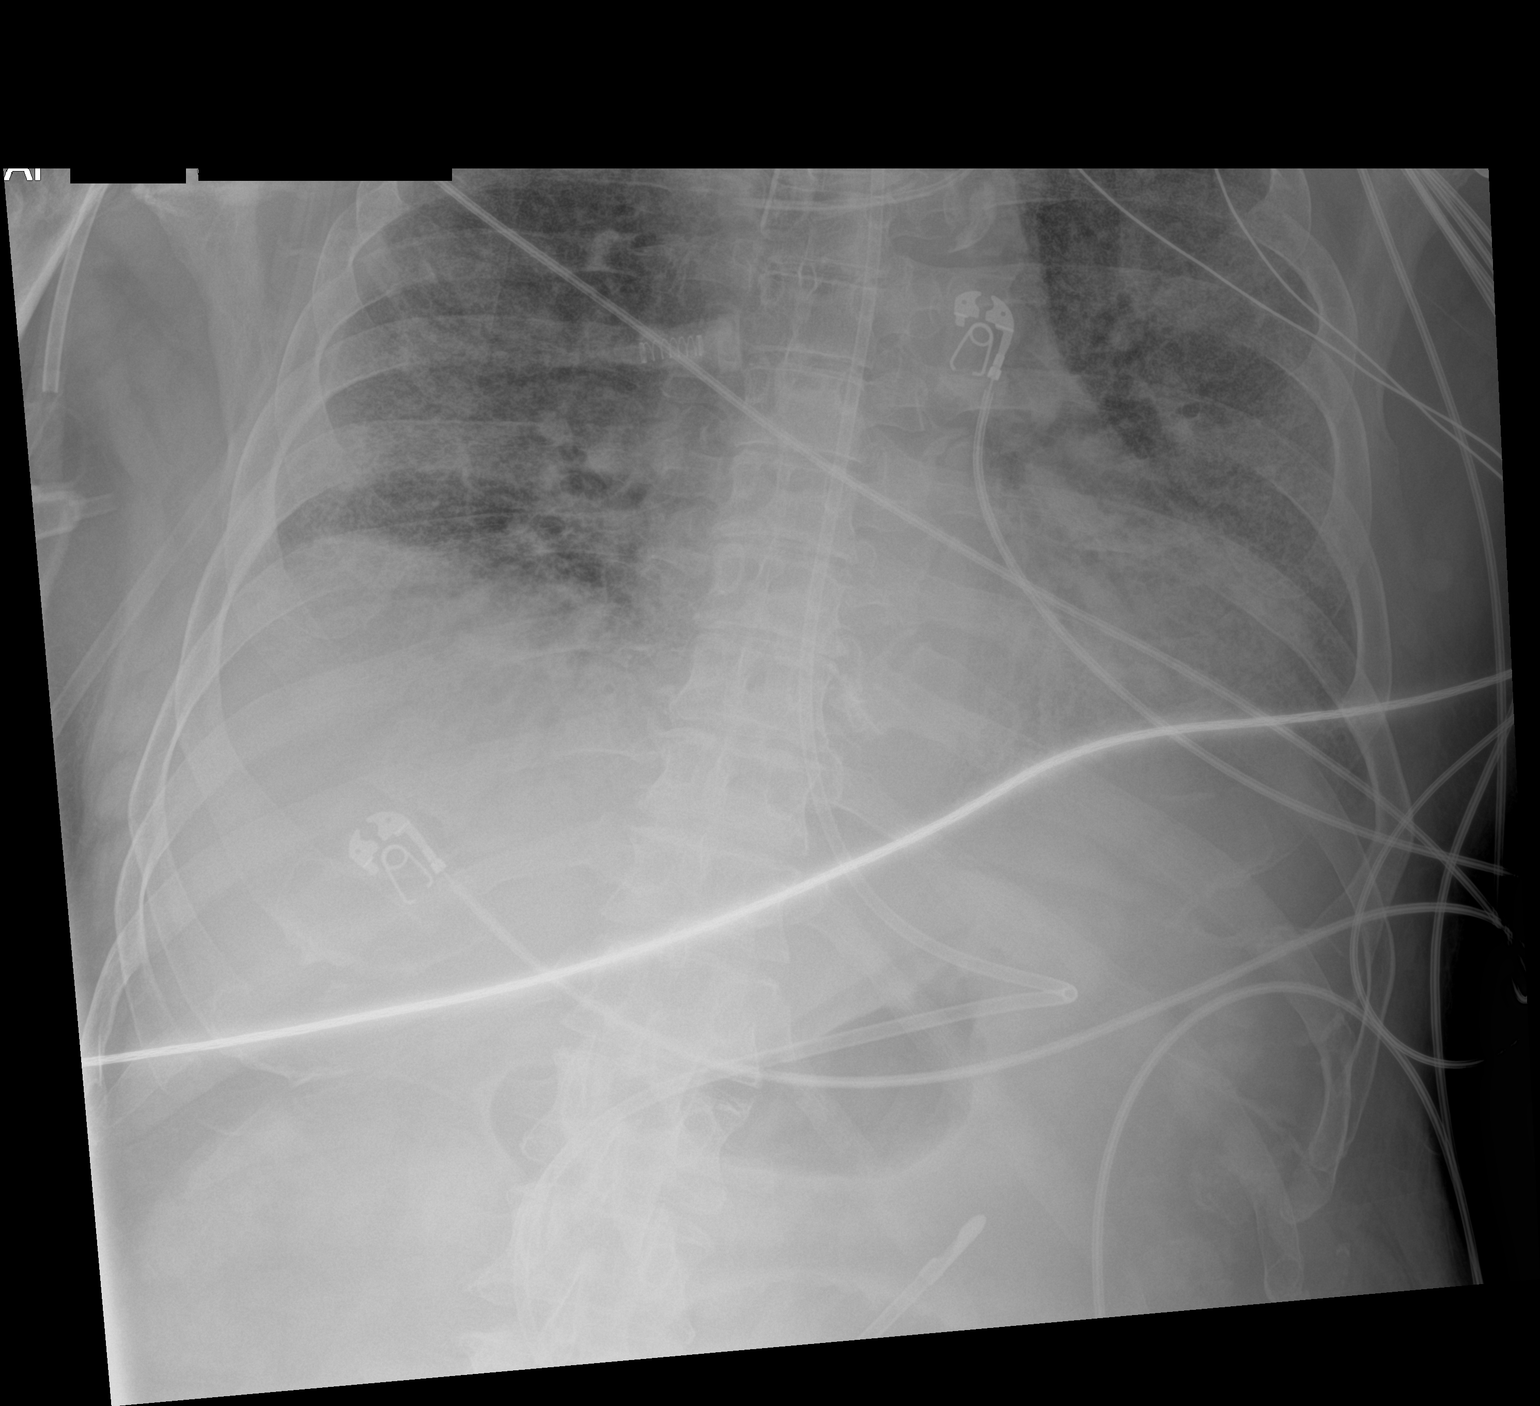
[im 2/2]
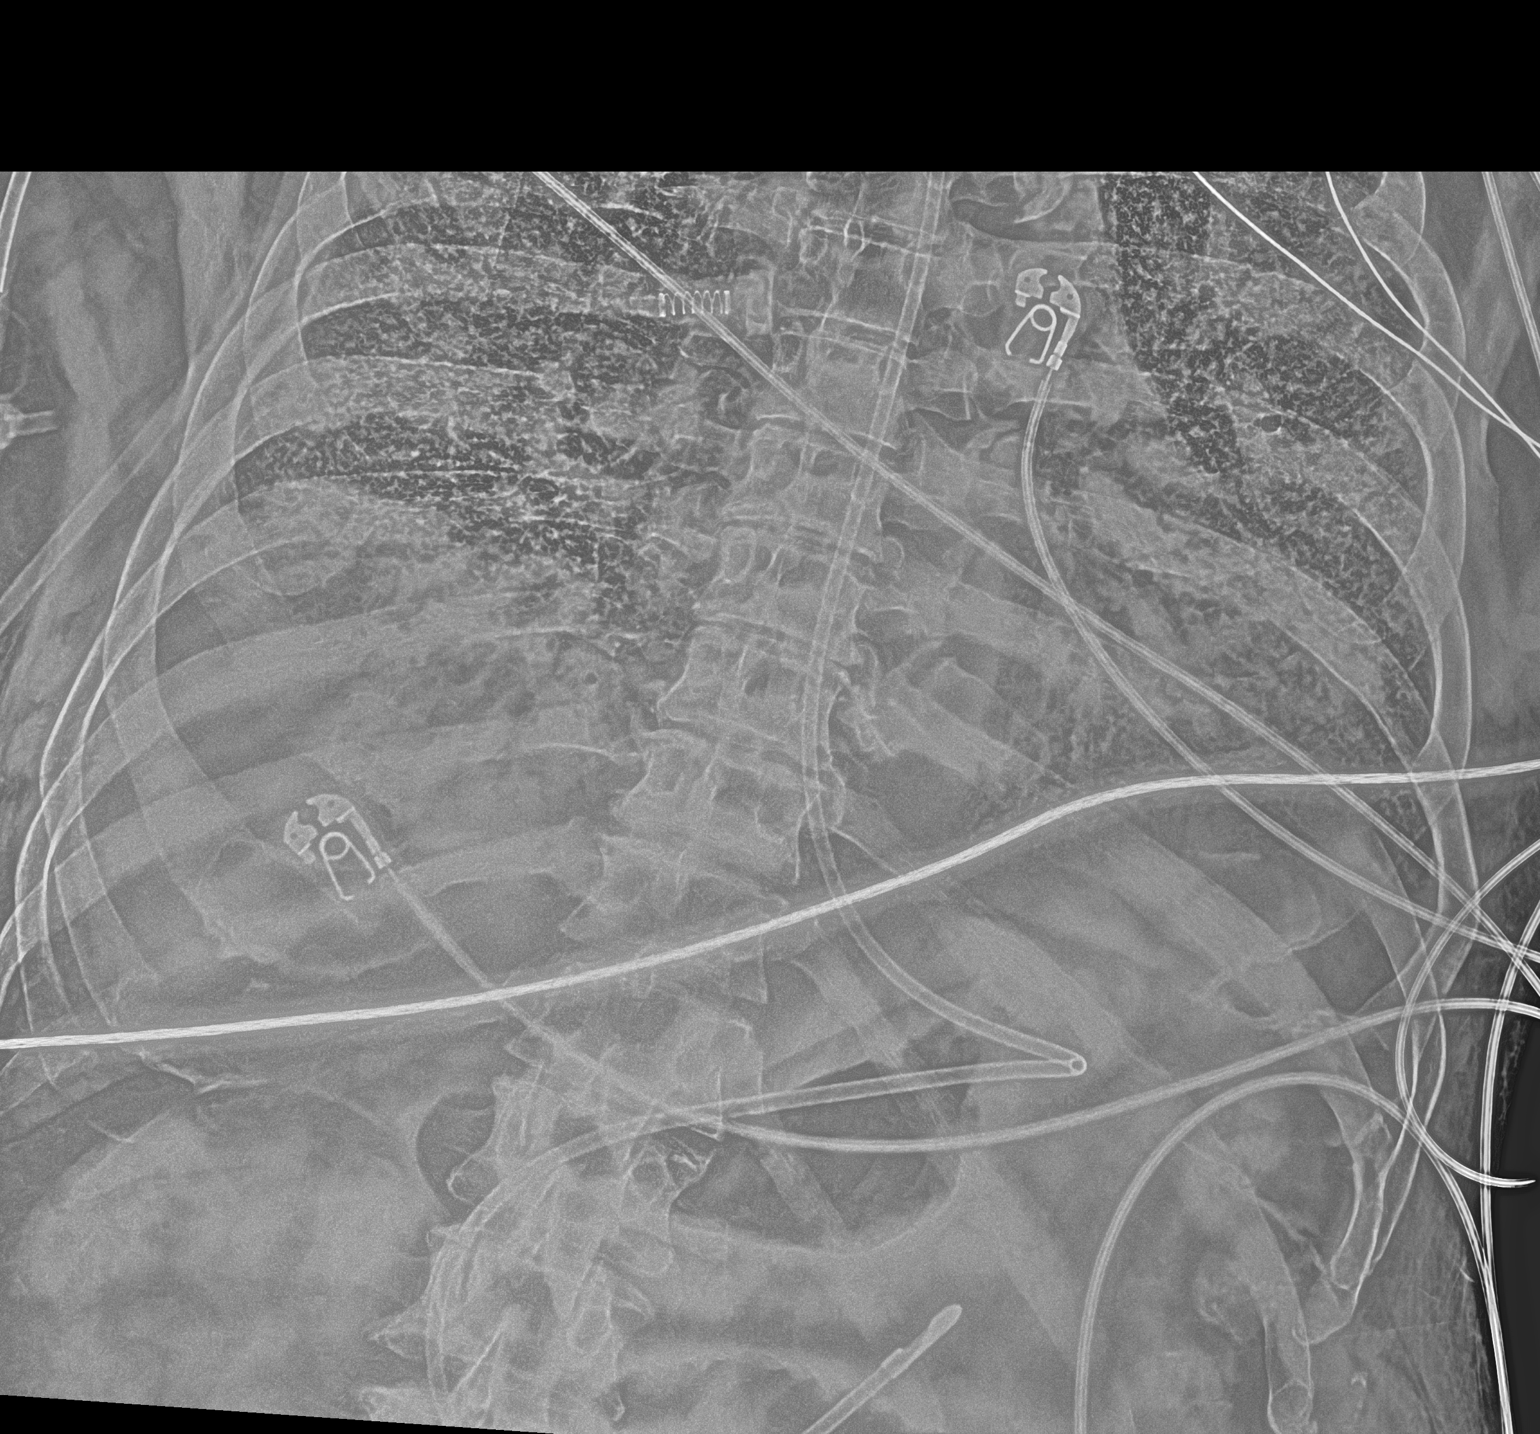

[2 of 2 positions shown; findings below may reference images not displayed]

FINDINGS: The enteric feeding tube tip is in the fourth portion of the
duodenum near the ligament of Treitz.

The endotracheal tube is 3 cm above the carina.

Persistent interstitial and airspace process in the lungs.
IMPRESSION: Feeding tube tip is in the fourth portion of the duodenum.
